# Patient Record
Sex: Female | Born: 1980
Health system: Southern US, Community
[De-identification: ages and names within clinical notes are randomized; demographics above are authoritative.]

## PROBLEM LIST (undated history)

## (undated) ENCOUNTER — Inpatient Hospital Stay (HOSPITAL_COMMUNITY): Payer: Self-pay

## (undated) HISTORY — PX: NO PAST SURGERIES: SHX2092

---

## 2008-05-09 ENCOUNTER — Emergency Department (HOSPITAL_COMMUNITY): Admission: EM | Admit: 2008-05-09 | Discharge: 2008-05-09 | Payer: Self-pay | Admitting: Emergency Medicine

## 2009-08-05 ENCOUNTER — Ambulatory Visit (HOSPITAL_COMMUNITY): Admission: RE | Admit: 2009-08-05 | Discharge: 2009-08-05 | Payer: Self-pay | Admitting: Obstetrics & Gynecology

## 2009-11-26 ENCOUNTER — Ambulatory Visit: Payer: Self-pay | Admitting: Obstetrics & Gynecology

## 2009-12-03 ENCOUNTER — Ambulatory Visit: Payer: Self-pay | Admitting: Obstetrics and Gynecology

## 2009-12-03 ENCOUNTER — Inpatient Hospital Stay (HOSPITAL_COMMUNITY): Admission: AD | Admit: 2009-12-03 | Discharge: 2009-12-07 | Payer: Self-pay | Admitting: Family Medicine

## 2009-12-11 ENCOUNTER — Inpatient Hospital Stay (HOSPITAL_COMMUNITY): Admission: AD | Admit: 2009-12-11 | Discharge: 2009-12-11 | Payer: Self-pay | Admitting: Obstetrics & Gynecology

## 2009-12-11 ENCOUNTER — Ambulatory Visit: Payer: Self-pay | Admitting: Advanced Practice Midwife

## 2009-12-17 ENCOUNTER — Ambulatory Visit: Payer: Self-pay | Admitting: Obstetrics and Gynecology

## 2010-12-26 LAB — CBC
HCT: 38.9 % (ref 36.0–46.0)
MCHC: 33.6 g/dL (ref 30.0–36.0)
MCHC: 33.9 g/dL (ref 30.0–36.0)
MCV: 85.1 fL (ref 78.0–100.0)
Platelets: 177 10*3/uL (ref 150–400)
Platelets: 252 10*3/uL (ref 150–400)
RBC: 3.31 MIL/uL — ABNORMAL LOW (ref 3.87–5.11)
RDW: 14.5 % (ref 11.5–15.5)
WBC: 13.4 10*3/uL — ABNORMAL HIGH (ref 4.0–10.5)

## 2014-05-12 ENCOUNTER — Emergency Department (INDEPENDENT_AMBULATORY_CARE_PROVIDER_SITE_OTHER)
Admission: EM | Admit: 2014-05-12 | Discharge: 2014-05-12 | Disposition: A | Discharge status: Home / Self Care | Payer: Self-pay | Source: Home / Self Care | Attending: Family Medicine | Admitting: Family Medicine

## 2014-05-12 ENCOUNTER — Encounter (HOSPITAL_COMMUNITY): Payer: Self-pay | Admitting: Emergency Medicine

## 2014-05-12 DIAGNOSIS — K2100 Gastro-esophageal reflux disease with esophagitis, without bleeding: Secondary | ICD-10-CM

## 2014-05-12 MED ORDER — OMEPRAZOLE 40 MG PO CPDR: 40.0000 mg | DELAYED_RELEASE_CAPSULE | Freq: Every day | ORAL | Status: DC

## 2014-05-12 NOTE — ED Provider Notes (Signed)
CSN: [id number]     Arrival date & time 05/12/14  1533 History   First MD Initiated Contact with Patient 05/12/14 1624     Chief Complaint  Patient presents with  . Abdominal Pain   (Consider location/radiation/quality/duration/timing/severity/associated sxs/prior Treatment) HPI  Epigastric pain: started today. Burning. Associated w/ nausea. Comes on after meals, especially coffee. Has not taken anything to try to make it better. No change from this morning. Denies fever, diarrhea, CP, SOB, sycnope, palpitations. Some radiation up chest from stomach   History reviewed. No pertinent past medical history. History reviewed. No pertinent past surgical history. No family history on file. History  Substance Use Topics  . Smoking status: Never Smoker   . Smokeless tobacco: Not on file  . Alcohol Use: No   OB History   Grav Para Term Preterm Abortions TAB SAB Ect Mult Living                 Review of Systems Per HPI with all other pertinent systems negative.   Allergies  Review of patient's allergies indicates no known allergies.  Home Medications   Prior to Admission medications   Medication Sig Start Date End Date Taking? Authorizing Provider  omeprazole (PRILOSEC) 40 MG capsule Take 1 capsule (40 mg total) by mouth daily. 05/12/14   Ozella Rocksavid J Elward Nocera, MD   BP 128/87  Pulse 84  Temp(Src) 98.6 F (37 C) (Oral)  Resp 14  SpO2 98% Physical Exam  Constitutional: She is oriented to person, place, and time. She appears well-developed and well-nourished. No distress.  HENT:  Head: Normocephalic and atraumatic.  Eyes: EOM are normal. Pupils are equal, round, and reactive to light.  Neck: Normal range of motion.  Cardiovascular: Normal rate, normal heart sounds and intact distal pulses.   No murmur heard. Pulmonary/Chest: Effort normal and breath sounds normal. No respiratory distress. She has no wheezes. She exhibits no tenderness.  Abdominal: Soft. Bowel sounds are normal. There  is tenderness (minimal epigastric tenderness on palpation. ). There is no rebound and no guarding.  Musculoskeletal: Normal range of motion. She exhibits no edema and no tenderness.  Neurological: She is alert and oriented to person, place, and time. No cranial nerve deficit.  Skin: She is not diaphoretic.  Psychiatric: She has a normal mood and affect. Her behavior is normal. Judgment and thought content normal.    ED Course  Procedures (including critical care time) Labs Review Labs Reviewed - No data to display  Imaging Review No results found.   MDM   1. Reflux esophagitis   No cardiac or pleuritic etiology Start PPI Handouts given Precautions given and all questions answered  Shelly Flattenavid Makylie Rivere, MD Family Medicine 05/12/2014, 4:44 PM      Ozella Rocksavid J Javeion Cannedy, MD 05/12/14 [phone]

## 2014-05-12 NOTE — Discharge Instructions (Signed)
You are suffering from reflux which comes from too much acid in your stomach. This burned your throat.  Please take the prilosec (acid reducing pill) for 14 days. Then as needed Please come back if you get worse.    Opciones de alimentos para pacientes con reflujo gastroesofgico (Food Choices for Gastroesophageal Reflux Disease) Cuando se tiene reflujo gastroesofgico (ERGE), los alimentos que se ingieren y los hbitos de alimentacin son muy importantes. Elegir los alimentos adecuados puede ayudar a Paramedic las molestias ocasionadas por el [place]. QU PAUTAS GENERALES DEBO SEGUIR?  Elija las frutas, los vegetales, los cereales integrales, los productos lcteos, la carne de [place], de pescado y de ave con bajo contenido de grasas.  Limite las grasas, [address] [place], los aderezos para Lennon, la [place], los frutos secos y Programme researcher, broadcasting/film/video.  Lleve un registro de las comidas para identificar los alimentos que ocasionan sntomas.  Evite los alimentos que le ocasionen reflujo. Pueden ser distintos para cada persona.  Haga comidas pequeas con frecuencia en lugar de tres comidas [employer].  Coma lentamente, en un clima distendido.  Limite el consumo de alimentos fritos.  Cocine los alimentos utilizando mtodos que no sean la fritura.  Evite el consumo alcohol.  Evite beber grandes cantidades de lquidos con las comidas.  Evite agacharse o recostarse hasta despus de 2 o 3horas de haber comido. QU ALIMENTOS NO SE RECOMIENDAN? Los siguientes son algunos alimentos y bebidas que pueden empeorar los sntomas: Veterinary surgeon. Jugo de tomate. Salsa de tomate y espagueti. Ajes. Cebolla y [place]. Rbano picante. Frutas Naranjas, pomelos y limn (fruta y [place]). Carnes Carnes de [place], de pescado y de ave con gran contenido de grasas. Esto incluye los perros calientes, las [place], el [place], la salchicha, el salame y el tocino. Lcteos Leche entera y [place]. Merck & Co. Crema. Mantequilla. Helados. Queso crema.  Bebidas Caf y t negro, con o sin cafena Bebidas gaseosas o energizantes. Condimentos Salsa picante. Salsa barbacoa.  Dulces/postres Chocolate y cacao. Rosquillas. Menta y mentol. Grasas y [employer] con alto contenido de grasas, incluidas las papas fritas. Otros Vinagre. Especias picantes, como la [employer], la pimienta blanca, la pimienta roja, la pimienta de cayena, el curry en [place], los clavos de [place], el jengibre y el [place] en polvo. Los artculos mencionados arriba pueden no ser [employer]on de las bebidas y los alimentos que se Theatre stage manager. Comunquese con el nutricionista para recibir ms informacin. Document Released: 06/28/2005 Document Revised: 09/23/2013 [facility] Patient Information 2015 [place], [place]. This information is not intended to replace advice given to you by your health care provider. Make sure you discuss any questions you have with your health care provider.  Esofagitis  (Esophagitis)  La esofagitis es la inflamacin del esfago. Puede haber inflamacin y Engineer, mining. Este problema hacer que tragar sea difcil y doloroso. CAUSAS  La mayora de las causas que producen esofagitis no son graves. Diferentes factores pueden ocasionarla, entre ellos:  Reflujo gastroesofgico. El reflujo gastroesofgico ocurre cuando el cido del estmago pasa al esfago.  Vmitos recurrentes.  Reacciones alrgicas.  Ciertos medicamentos, especialmente aquellos que vienen en pastillas grandes.  La ingestin de productos qumicos nocivos, tales como productos de limpieza del hogar.  Consumo excesivo de alcohol.  Una infeccin del esfago.  Tratamiento de radiacin para Management consultant.  Ciertas enfermedades como la sarcoidosis, enfermedad de Crohn, y la esclerodermia. Estas enfermedades pueden causar esofagitis recurrente. SNTOMAS   Dificultad para tragar.  Dolor al tragar.  Dolor en el pecho.  Dificultad  para respirar.  Nuseas.  Vmitos.  Dolor abdominal. DIAGNSTICO  El mdico le preguntar acerca de sus sntomas y le har un examen fsico. Dependiendo de lo que el mdico encuentre, tambin podr Radio producerindicar ciertas pruebas, por ejemplo:   Radiografa de bario. Le darn para beber una solucin que recubre el esfago, y se tomarn radiografas.  Endoscopa. Se inserta un tubo con luz por el esfago para que el mdico pueda examinar el rea.  Pruebas de alergia. A veces se pueden realizar en las visitas de control. TRATAMIENTO  El tratamiento depender de la causa de la esofagitis. En algunos casos, le recetarn corticoides u otros medicamentos para ayudar a Paramedicaliviar sus sntomas o tratar la causa subyacente del problema. Los medicamentos que le podrn recetar son:   Raynelle DickLidocana viscosa, para Catering managersuavizar el esfago.  Anticidos.  Reductores del cido.  Inhibidores de la bomba de protones.  Antivirales para ciertas infecciones virales del esfago.  Antimicticos para ciertas infecciones fngicas en el esfago.  Antibiticos, dependiendo de la causa de la esofagitis. INSTRUCCIONES PARA EL CUIDADO EN EL HOGAR   Evite las comidas y bebidas que United Stationersempeoran los problemas.  Haga comidas pequeas durante Glass blower/designerel da en lugar de 3 comidas abundantes.  Evite comer durante las 3 horas antes de Conneracostarse.  Si tiene problemas para Theme park managertomar pastillas, use un cortador de pldoras para disminuir el tamao y la probabilidad de que la pldora se [place]queda pegada y lesione el fondo del esfago. Beber agua despus de tomar una pldora tambin ayuda.  Si fuma, abandone el hbito.  Mantenga un peso saludable.  Use ropas sueltas. No use nada apretado alrededor de la cintura que cause presin en el estmago.  Levante la cabecera de la cama 6 a 8 pulgadas (15 a 20 cm) con bloques de madera. Usar almohadas extra no ayuda.  Tome slo medicamentos de venta libre o recetados, segn las indicaciones del mdico. SOLICITE  ATENCIN MDICA DE INMEDIATO SI:   Siente un dolor intenso en el pecho que se irradia hacia el cuello, los brazos o la Boveymandbula.  Se siente transpirado, mareado o sufre un desmayo.  Le falta el aire.  Vomita sangre.  Tiene dificultad o dolor al tragar.  La materia fecal es negra, de aspecto alquitranado.  Tiene fiebre.  Tiene una sensacin de ardor en el pecho ms de 3 veces a la semana durante ms de 2 semanas.  No puede tragar, beber o comer.  Babea porque no puede tragar la saliva. ASEGRESE DE QUE:   Comprende estas instrucciones.  Controlar su enfermedad.  Solicitar ayuda de inmediato si no mejora o si empeora. Document Released: 09/18/2005 Document Revised: 12/11/2011 [facility] Patient Information 2015 [place], [place]. This information is not intended to replace advice given to you by your health care provider. Make sure you discuss any questions you have with your health care provider.

## 2014-05-12 NOTE — ED Notes (Signed)
Epigastric pain, "pressure" in chest, nauseated, sob- onset this am.  Denies cough, denies cold symptoms, denies runny nose.

## 2014-10-24 ENCOUNTER — Emergency Department (HOSPITAL_COMMUNITY): Payer: Self-pay

## 2014-10-24 ENCOUNTER — Emergency Department (HOSPITAL_COMMUNITY)
Admission: EM | Admit: 2014-10-24 | Discharge: 2014-10-24 | Disposition: A | Discharge status: Home / Self Care | Payer: Self-pay | Attending: Emergency Medicine | Admitting: Emergency Medicine

## 2014-10-24 ENCOUNTER — Encounter (HOSPITAL_COMMUNITY): Payer: Self-pay | Admitting: *Deleted

## 2014-10-24 DIAGNOSIS — Z3202 Encounter for pregnancy test, result negative: Secondary | ICD-10-CM | POA: Insufficient documentation

## 2014-10-24 DIAGNOSIS — Z79899 Other long term (current) drug therapy: Secondary | ICD-10-CM | POA: Insufficient documentation

## 2014-10-24 DIAGNOSIS — R102 Pelvic and perineal pain: Secondary | ICD-10-CM | POA: Insufficient documentation

## 2014-10-24 LAB — COMPREHENSIVE METABOLIC PANEL
ALBUMIN: 4.8 g/dL (ref 3.5–5.2)
ALK PHOS: 51 U/L (ref 39–117)
ALT: 23 U/L (ref 0–35)
ANION GAP: 11 (ref 5–15)
AST: 28 U/L (ref 0–37)
BUN: 8 mg/dL (ref 6–23)
CALCIUM: 9.6 mg/dL (ref 8.4–10.5)
CO2: 25 mmol/L (ref 19–32)
CREATININE: 0.63 mg/dL (ref 0.50–1.10)
Chloride: 100 mmol/L (ref 96–112)
GFR calc Af Amer: >90 mL/min (ref >90)
Glucose, Bld: 94 mg/dL (ref 70–99)
POTASSIUM: 3.6 mmol/L (ref 3.5–5.1)
SODIUM: 136 mmol/L (ref 135–145)
Total Bilirubin: 1.4 mg/dL — ABNORMAL HIGH (ref 0.3–1.2)
Total Protein: 8.1 g/dL (ref 6.0–8.3)

## 2014-10-24 LAB — CBC WITH DIFFERENTIAL/PLATELET
BASOS PCT: 1 % (ref 0–1)
Basophils Absolute: 0.1 10*3/uL (ref 0.0–0.1)
Eosinophils Absolute: 1 10*3/uL — ABNORMAL HIGH (ref 0.0–0.7)
Eosinophils Relative: 12 % — ABNORMAL HIGH (ref 0–5)
HEMATOCRIT: 42.3 % (ref 36.0–46.0)
Hemoglobin: 14.2 g/dL (ref 12.0–15.0)
LYMPHS ABS: 3 10*3/uL (ref 0.7–4.0)
Lymphocytes Relative: 34 % (ref 12–46)
MCH: 27.8 pg (ref 26.0–34.0)
MCHC: 33.6 g/dL (ref 30.0–36.0)
MCV: 82.9 fL (ref 78.0–100.0)
Monocytes Absolute: 0.6 10*3/uL (ref 0.1–1.0)
Monocytes Relative: 7 % (ref 3–12)
NEUTROS ABS: 4.1 10*3/uL (ref 1.7–7.7)
Neutrophils Relative %: 46 % (ref 43–77)
PLATELETS: 318 10*3/uL (ref 150–400)
RBC: 5.1 MIL/uL (ref 3.87–5.11)
RDW: 13 % (ref 11.5–15.5)
WBC: 8.9 10*3/uL (ref 4.0–10.5)

## 2014-10-24 LAB — URINALYSIS, ROUTINE W REFLEX MICROSCOPIC
Bilirubin Urine: NEGATIVE
GLUCOSE, UA: NEGATIVE mg/dL
HGB URINE DIPSTICK: NEGATIVE
KETONES UR: NEGATIVE mg/dL
Leukocytes, UA: NEGATIVE
Nitrite: NEGATIVE
Protein, ur: NEGATIVE mg/dL
SPECIFIC GRAVITY, URINE: 1.016 (ref 1.005–1.030)
Urobilinogen, UA: 0.2 mg/dL (ref 0.0–1.0)
pH: 7 (ref 5.0–8.0)

## 2014-10-24 LAB — POC URINE PREG, ED: Preg Test, Ur: NEGATIVE

## 2014-10-24 LAB — WET PREP, GENITAL
TRICH WET PREP: NONE SEEN
Yeast Wet Prep HPF POC: NONE SEEN

## 2014-10-24 IMAGING — US US TRANSVAGINAL NON-OB
1 series · 13 of 25 positions shown · non-contrast
Comparison: None

CLINICAL DATA: Pelvic pain, duration 2 weeks



[Series 1: us transvaginal non-ob · 0.21mm/px · 67 acquisitions, 13 frames shown]
[im 1/67]
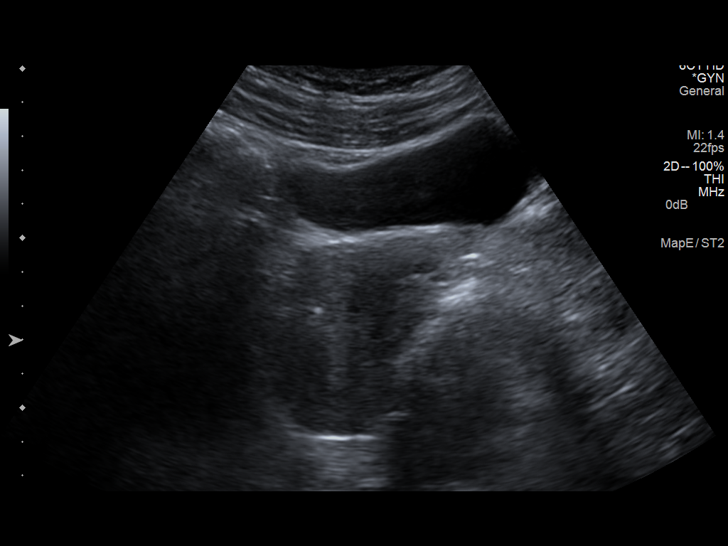
[im 6/67]
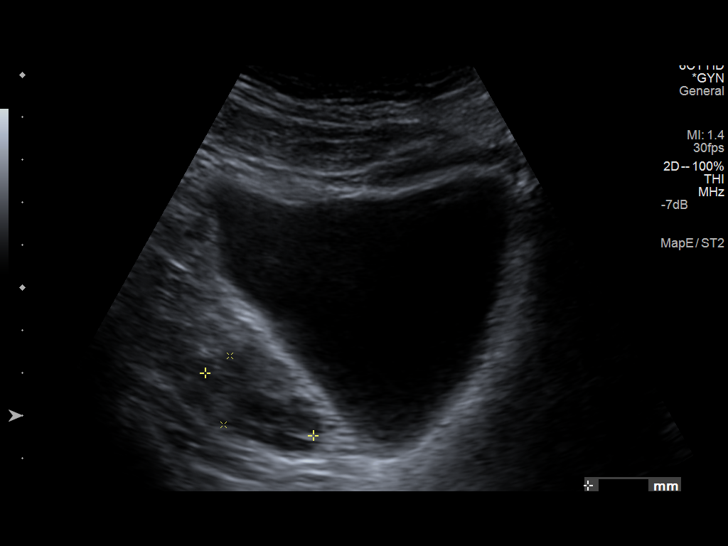
[im 12/67]
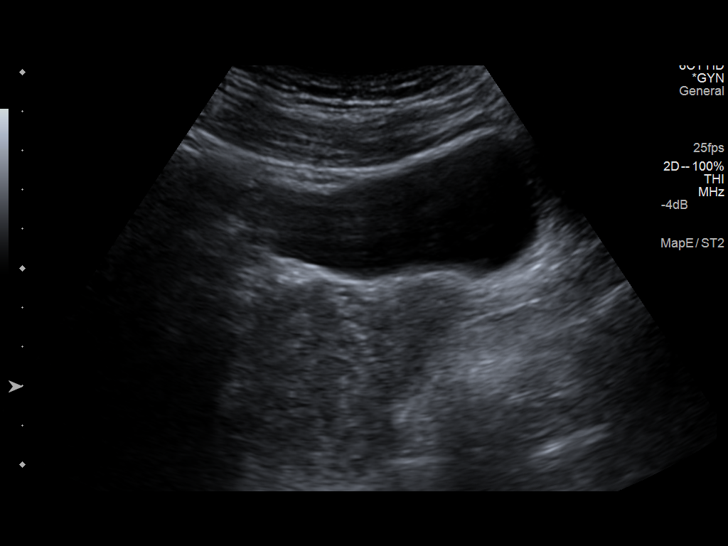
[im 17/67]
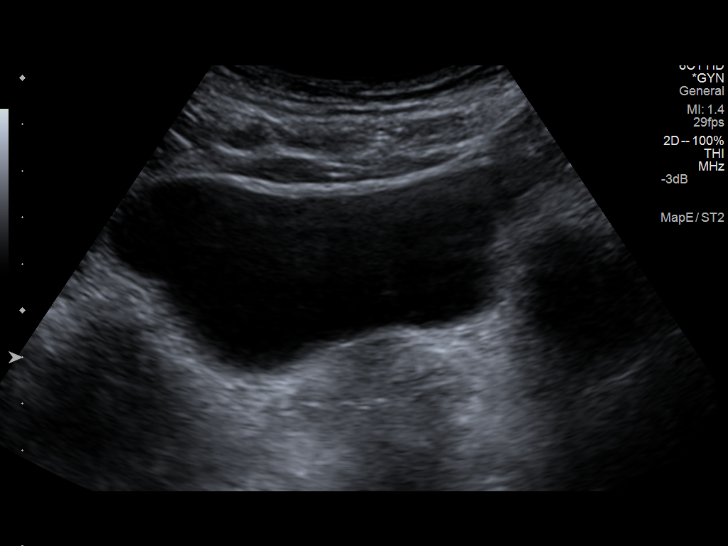
[im 23/67]
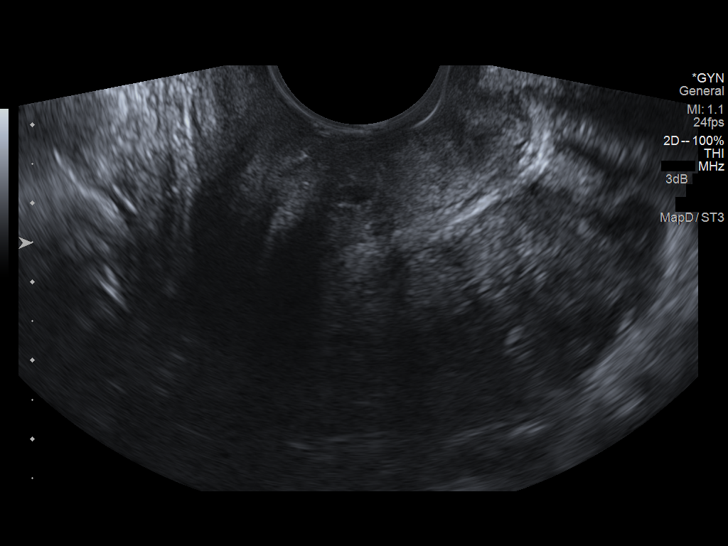
[im 28/67]
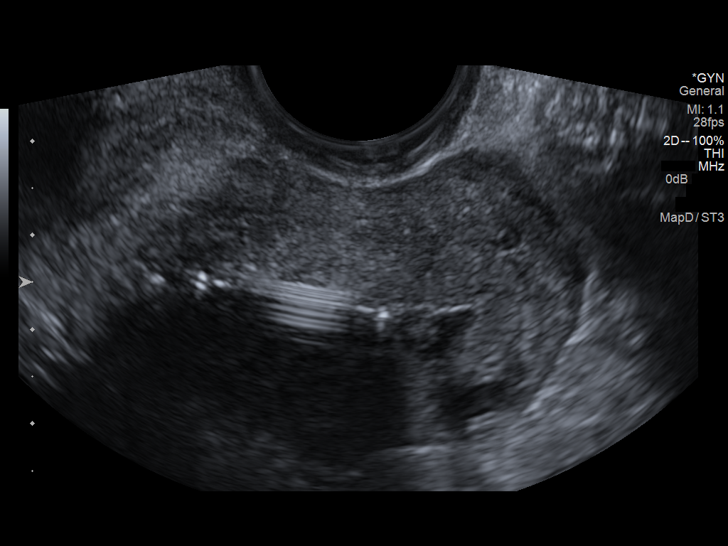
[im 34/67]
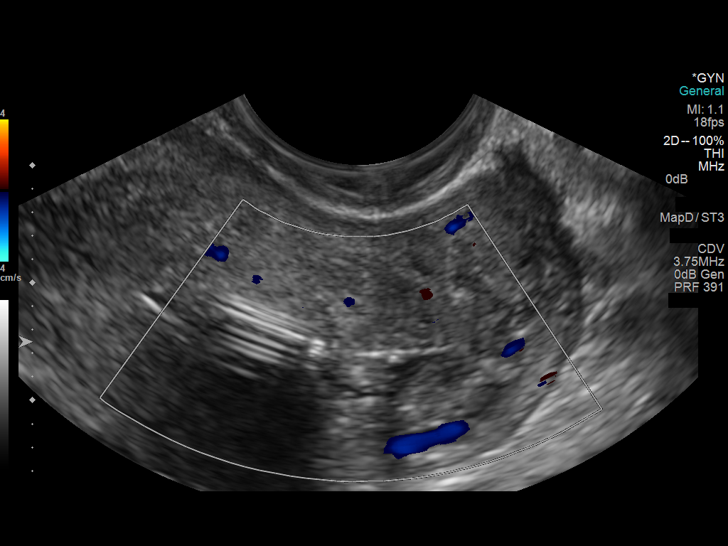
[im 39/67]
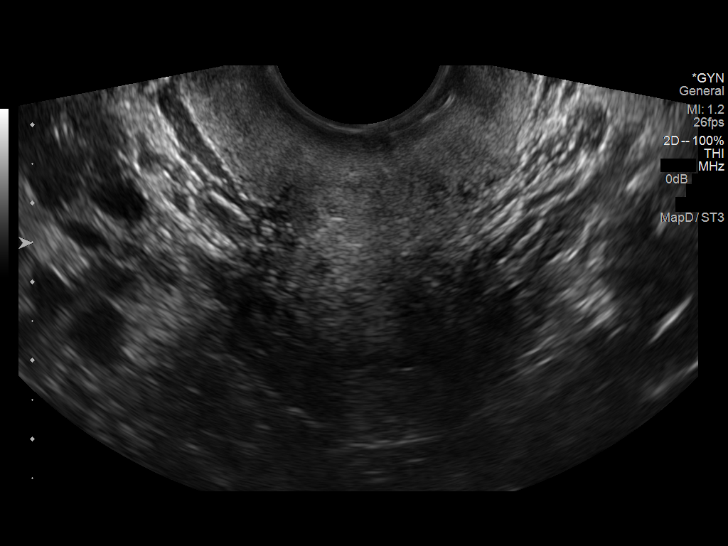
[im 45/67]
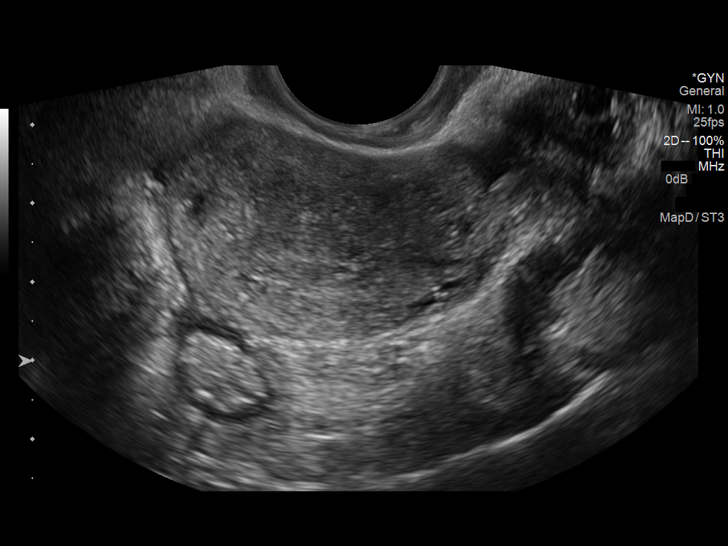
[im 50/67]
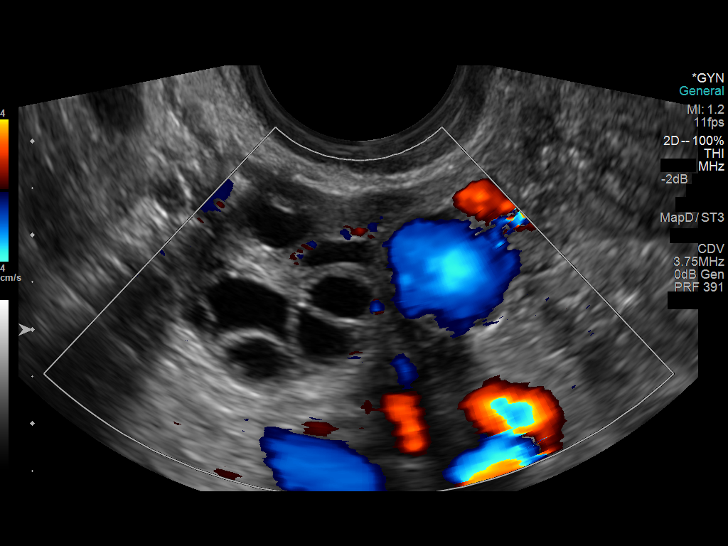
[im 56/67]
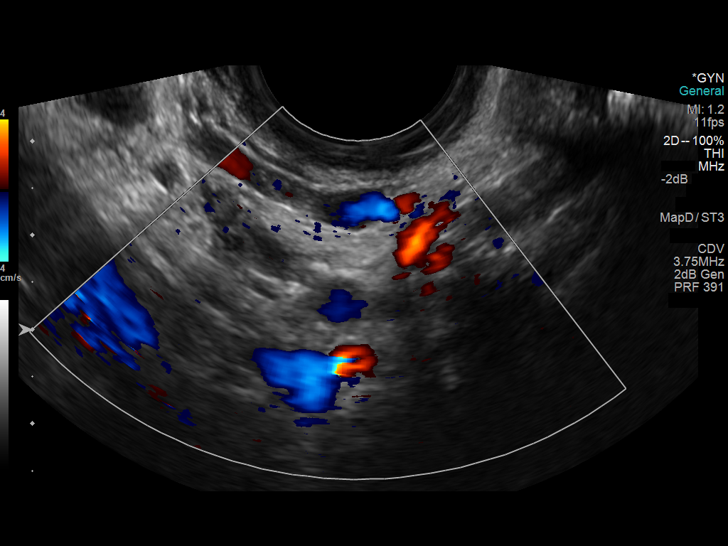
[im 61/67]
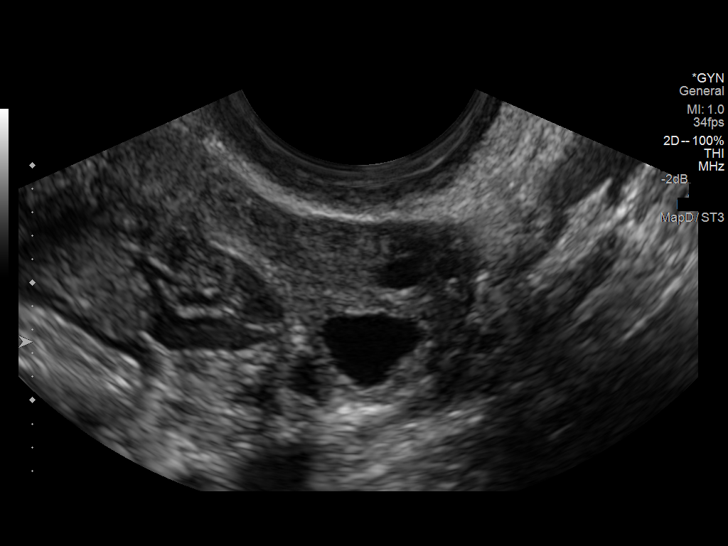
[im 67/67]
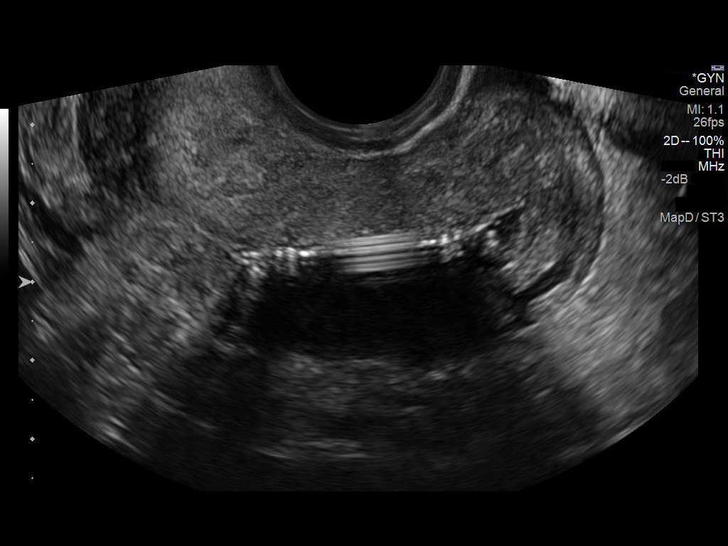

[13 of 25 positions shown; findings below may reference images not displayed]

FINDINGS: Uterus

Measurements: 8.0 by 3.7 by 5.3 cm. No fibroids or other mass
visualized. Retroverted position.

Endometrium

Thickness: 2 mm. IUD in place. Small amount of fluid in the
endometrial cavity. Indistinct echogenicity along the fundal margin
of the IUD, about 4 mm in diameter, potentially from blood products
or debris, without internal flow signal observed.

Right ovary

Measurements: 2.9 by 2.6 by 2.5 cm. Normal appearance/no adnexal
mass.

Left ovary

Measurements: 3.5 by 2.1 by 1.7 cm. Normal appearance/no adnexal
mass.

Other findings

No free fluid.  Mild prominence of parametrial vasculature.
IMPRESSION: 1. IUD in place. There is a small amount of surrounding fluid in the
endometrial cavity along with a 4 mm in diameter focus of echogenic
material along the fundal side of the IUD. This could represent
blood products. If the patient is having clinical symptoms of
endometritis then removal of the IUD is likely warranted.
2. Prominence of parametrial vasculature. The this is typically
incidental but can also be encountered in pelvic congestion.

## 2014-10-24 MED ORDER — IBUPROFEN 800 MG PO TABS: 800.0000 mg | ORAL_TABLET | Freq: Three times a day (TID) | ORAL | Status: DC

## 2014-10-24 NOTE — ED Notes (Addendum)
Pt reports llq pain for 2 weeks, worse yesterday and got worse. Denies issues using the restroom or vaginal complaints. Pt repors worse pain when laying on left side,

## 2014-10-24 NOTE — ED Provider Notes (Signed)
CSN: [id number]     Arrival date & time 10/24/14  1316 History   First MD Initiated Contact with Patient 10/24/14 1328     Chief Complaint  Patient presents with  . Abdominal Pain     (Consider location/radiation/quality/duration/timing/severity/associated sxs/prior Treatment) HPI The patient has had left lower quadrant pain that has been present for 2 weeks duration. It got significantly worse last night. The patient reports she couldn't sleep last night due to pain. It has a burning quality to it. The patient has had some nausea associated but no vomiting, diarrhea, pain or burning with urination. The patient reports she has a Mirena IUD and thus does not have regular periods. No abnormal vaginal discharge. She denies ever having had similar pain. History reviewed. No pertinent past medical history. History reviewed. No pertinent past surgical history. History reviewed. No pertinent family history. History  Substance Use Topics  . Smoking status: Never Smoker   . Smokeless tobacco: Not on file  . Alcohol Use: No   OB History    No data available     Review of Systems  10 Systems reviewed and are negative for acute change except as noted in the HPI.   Allergies  Review of patient's allergies indicates no known allergies.  Home Medications   Prior to Admission medications   Medication Sig Start Date End Date Taking? Authorizing Provider  ibuprofen (ADVIL,MOTRIN) 800 MG tablet Take 1 tablet (800 mg total) by mouth 3 (three) times daily. 10/24/14   Arby BarretteMarcy Izel Eisenhardt, MD  omeprazole (PRILOSEC) 40 MG capsule Take 1 capsule (40 mg total) by mouth daily. 05/12/14   Ozella Rocksavid J Merrell, MD   BP 116/69 mmHg  Pulse 100  Temp(Src) 97.9 F (36.6 C) (Oral)  Resp 18  SpO2 99% Physical Exam  Constitutional: She is oriented to person, place, and time. She appears well-developed and well-nourished.  HENT:  Head: Normocephalic and atraumatic.  Eyes: EOM are normal. Pupils are equal, round,  and reactive to light.  Neck: Neck supple.  Cardiovascular: Normal rate, regular rhythm, normal heart sounds and intact distal pulses.   Pulmonary/Chest: Effort normal and breath sounds normal.  Abdominal: Soft. Bowel sounds are normal. She exhibits no distension. There is tenderness (Patient has mild tenderness left lower quadrant. There is no guarding or mass present.).  Genitourinary: Vagina normal. No vaginal discharge found.  Speculum examination vaginal vault is normal in appearance. Cervix has no drainage or discharge. IUD string is visible. Bimanual examination has no cervical motion tenderness. The uterus is nontender to palpation. Pain localizes in the left adnexa. No appreciable adnexal mass or fullness.  Musculoskeletal: Normal range of motion. She exhibits no edema.  Neurological: She is alert and oriented to person, place, and time. She has normal strength. Coordination normal. GCS eye subscore is 4. GCS verbal subscore is 5. GCS motor subscore is 6.  Skin: Skin is warm, dry and intact.  Psychiatric: She has a normal mood and affect.    ED Course  Procedures (including critical care time) Labs Review Labs Reviewed  WET PREP, GENITAL - Abnormal; Notable for the following:    Clue Cells Wet Prep HPF POC FEW (*)    WBC, Wet Prep HPF POC FEW (*)    All other components within normal limits  COMPREHENSIVE METABOLIC PANEL - Abnormal; Notable for the following:    Total Bilirubin 1.4 (*)    All other components within normal limits  CBC WITH DIFFERENTIAL/PLATELET - Abnormal; Notable for the following:  Eosinophils Relative 12 (*)    Eosinophils Absolute 1.0 (*)    All other components within normal limits  URINALYSIS, ROUTINE W REFLEX MICROSCOPIC  POC URINE PREG, ED  GC/CHLAMYDIA PROBE AMP ([place])    Imaging Review US Transvaginal Non-ob  10/24/2014   CLINICAL DATA:  Pelvic pain, duration 2 weeks  EXAM: TRANSABDOMINAL AND TRANSVAGINAL ULTRASOUND OF PELVIS  TECHNIQUE:  Both transabdominal and transvaginal ultrasound examinations of the pelvis were performed. Transabdominal technique was performed for global imaging of the pelvis including uterus, ovaries, adnexal regions, and pelvic cul-de-sac. It was necessary to proceed with endovaginal exam following the transabdominal exam to visualize the endometrium.  COMPARISON:  None  FINDINGS: Uterus  Measurements: 8.0 by 3.7 by 5.3 cm. No fibroids or other mass visualized. Retroverted position.  Endometrium  Thickness: 2 mm. IUD in place. Small amount of fluid in the endometrial cavity. Indistinct echogenicity along the fundal margin of the IUD, about 4 mm in diameter, potentially from blood products or debris, without internal flow signal observed.  Right ovary  Measurements: 2.9 by 2.6 by 2.5 cm. Normal appearance/no adnexal mass.  Left ovary  Measurements: 3.5 by 2.1 by 1.7 cm. Normal appearance/no adnexal mass.  Other findings  No free fluid.  Mild prominence of parametrial vasculature.  IMPRESSION: 1. IUD in place. There is a small amount of surrounding fluid in the endometrial cavity along with a 4 mm in diameter focus of echogenic material along the fundal side of the IUD. This could represent blood products. If the patient is having clinical symptoms of endometritis then removal of the IUD is likely warranted. 2. Prominence of parametrial vasculature. The this is typically incidental but can also be encountered in pelvic congestion.   Electronically Signed   By: Herbie Baltimore M.D.   On: 10/24/2014 15:45   US Pelvis Complete  10/24/2014   CLINICAL DATA:  Pelvic pain, duration 2 weeks  EXAM: TRANSABDOMINAL AND TRANSVAGINAL ULTRASOUND OF PELVIS  TECHNIQUE: Both transabdominal and transvaginal ultrasound examinations of the pelvis were performed. Transabdominal technique was performed for global imaging of the pelvis including uterus, ovaries, adnexal regions, and pelvic cul-de-sac. It was necessary to proceed with endovaginal  exam following the transabdominal exam to visualize the endometrium.  COMPARISON:  None  FINDINGS: Uterus  Measurements: 8.0 by 3.7 by 5.3 cm. No fibroids or other mass visualized. Retroverted position.  Endometrium  Thickness: 2 mm. IUD in place. Small amount of fluid in the endometrial cavity. Indistinct echogenicity along the fundal margin of the IUD, about 4 mm in diameter, potentially from blood products or debris, without internal flow signal observed.  Right ovary  Measurements: 2.9 by 2.6 by 2.5 cm. Normal appearance/no adnexal mass.  Left ovary  Measurements: 3.5 by 2.1 by 1.7 cm. Normal appearance/no adnexal mass.  Other findings  No free fluid.  Mild prominence of parametrial vasculature.  IMPRESSION: 1. IUD in place. There is a small amount of surrounding fluid in the endometrial cavity along with a 4 mm in diameter focus of echogenic material along the fundal side of the IUD. This could represent blood products. If the patient is having clinical symptoms of endometritis then removal of the IUD is likely warranted. 2. Prominence of parametrial vasculature. The this is typically incidental but can also be encountered in pelvic congestion.   Electronically Signed   By: Herbie Baltimore M.D.   On: 10/24/2014 15:45     EKG Interpretation None      MDM  Final diagnoses:  Pelvic pain in female   Pregnancy is negative and urinalysis is negative. The patient's abdominal pain localizes on examination to the left adnexa. Ultrasound suggests a small amount of fluid adjacent to the Mirena IUD and the possibility of endometritis. Based on patient's examination I doubt this diagnosis. With the bimanual examination I was able to palpate the uterus and perform cervix motion without eliciting any significant tenderness. Localizing over the adnexa and away from the uterus re-create of the area of pain. The patient is well in appearance and has a nonsurgical abdominal examination. There was absolutely no  discharge coming from the cervical os and no friability. At this point I believe the patient needs a GYN exam this week to determine if the Mirena should be removed. I did not feel that it needed to be removed at this time based on my findings. The patient will be counseled on the needed follow-up.   Arby Barrette, MD 10/24/14 [phone]

## 2014-10-24 NOTE — Discharge Instructions (Signed)
Dolor pélvico  °(Pelvic Pain) ° Las causa del dolor pélvico en la mujer pueden ser muchas y pueden tener su origen en diferentes lugares. El dolor pélvico es el que aparece en la mitad inferior del abdomen y entre las caderas. Puede aparecer durante en un período corto de tiempo (agudo)o puede ser recurrente (crónico). Esta afección puede estar relacionada con trastornos que afectan a los órganos reproductivos femeninos (ginecológica), pero también puede deberse a problemas en la vejiga, cálculos renales, complicaciones intestinales, o problemas musculares o esqueléticos. Es importante solicitar ayuda de inmediato, sobre todo si ha sido intenso, agudo, o ha aparecido de manera súbita como un dolor inusual. También es importante obtener ayuda de inmediato, ya que algunos tipos de dolor pélvico puede poner en peligro la vida.  °CAUSAS  °A continuación veremos algunas de las causas del dolor pélvico. Las causas pueden clasificarse de diferentes modos.  °· Ginecológica. °¨ Enfermedad inflamatoria pélvica. °¨ Infecciones de transmisión sexual. °¨ Quiste de ovario o torsión de un ligamento ovárico ( torsión ovárica). °¨ La membrana que recubre internamente al útero desarrollándose fuera del útero (endometriosis). °¨ Fibromas, quistes o tumores. °¨ Ovulación. °· Embarazo. °¨ Embarazo fuera del útero (embarazo ectópico). °¨ Aborto espontáneo. °¨ Trabajo de parto. °¨ Desprendimiento de la placenta o ruptura del útero. °· Infecciones. °¨ Infección uterina (endometritis). °¨ Infección de la vejiga. °¨ Diverticulitis. °¨ Aborto relacionado con una infección uterina (aborto séptico). °· Vejiga. °¨ Inflamación de la vejiga (cistitis). °¨ Cálculos renales. °· Gastrointenstinal. °¨ Estreñimiento. °¨ Diverticulitis. °· Neurológico. °¨ Traumatismos. °¨ Sentir dolor pélvico debido a causas mentales o emocionales (psicosomático). °· Tumores en el intestino o en la pelvis. °EVALUACIÓN  °El médico hará una historia clínica detallada  según sus síntomas. Incluirá los cambios recientes en su salud, una cuidadosa historia ginecológica de sus periodos (menstruaciones) y una historia de su actividad sexual. Los antecedentes familiares y la historia clínica también son importantes. Su médico podrá indicar un examen pélvico. El examen pélvico ayudará a identificar la ubicación y la gravedad del dolor. También ayudará a evaluar los órganos que pueden estar involucrados. . Para identificar la causa del dolor pélvico y tratarlo adecuadamente, el médico puede indicar estudios. Estas pruebas pueden ser:  °· Test de embarazo. °· Ecografía pélvica. °· Radiografía del abdomen. °· Un análisis de orina o la evaluación de la secreción vaginal. °· Análisis de sangre. °INSTRUCCIONES PARA EL CUIDADO EN EL HOGAR  °· Solo tome medicamentos de venta libre o recetados para el dolor, malestar o fiebre, según las indicaciones del médico.   °· Haga reposo según las indicaciones del médico.   °· Consuma una dieta balanceada.   °· Beba gran cantidad de líquido para mantener la orina de tono claro o amarillo pálido.   °· Evite las relaciones sexuales, si le producen dolor.   °· Aplique compresas calientes o frías en la zona baja del abdomen según cual le calme el dolor.   °· Evite las situaciones estresantes.   °· Lleve un registro del dolor pélvico. Anote cuándo comenzó, dónde se localiza el dolor y si hay cosas que parecen estar asociadas con el dolor, como algún alimento o su ciclo menstrual. °· Concurra a las visitas de control con el médico, según las indicaciones.   °SOLICITE ATENCIÓN MÉDICA SI:  °· Los medicamentos no le calman el dolor. °· Tiene flujo vaginal anormal. °SOLICITE ATENCIÓN MÉDICA DE INMEDIATO SI:  °· Tiene un sangrado abundante por la vagina.   °· El dolor pélvico aumenta.   °· Se siente mareada o sufre un desmayo.   °·   Siente escalofros.   Siente dolor intenso al Geographical information systems officer u observa sangre en la orina.   Tiene diarrea o vmitos que no puede  controlar.   Tiene fiebre o sntomas que persisten durante ms de [address]  [address].  Tiene fiebre y los sntomas [address]   Ha sido abusada fsica o sexualmente.  ASEGRESE DE QUE:   Comprende estas instrucciones.  Controlar su enfermedad.  Solicitar ayuda de inmediato si no mejora o si empeora. Document Released: 12/15/2008 Document Revised: 02/02/2014 [facility] Patient Information 2015 [place], [place]. This information is not intended to replace advice given to you by your health care provider. Make sure you discuss any questions you have with your health care provider.  Emergency Department Resource Guide 1) Find a Doctor and Pay Out of Pocket Although you won't have to find out who is covered by your insurance plan, it is a good idea to ask around and get recommendations. You will then need to call the office and see if the doctor you have chosen will accept you as a new patient and what types of options they offer for patients who are self-pay. Some doctors offer discounts or will set up payment plans for their patients who do not have insurance, but you will need to ask so you aren't surprised when you get to your appointment.  2) Contact Your Local Health Department Not all health departments have doctors that can see patients for sick visits, but many do, so it is worth a call to see if yours does. If you don't know where your local health department is, you can check in your phone book. The CDC also has a tool to help you locate your state's health department, and many state websites also have listings of all of their local health departments.  3) Find a Walk-in Clinic If your illness is not likely to be very severe or complicated, you may want to try a walk in clinic. These are popping up all over the country in pharmacies, drugstores, and shopping centers. They're usually staffed by nurse practitioners or physician assistants that have been trained to treat common illnesses and  complaints. They're usually fairly quick and inexpensive. However, if you have serious medical issues or chronic medical problems, these are probably not your best option.  No Primary Care Doctor: - Call Health Connect at  [phone] - they can help you locate a primary care doctor that  accepts your insurance, provides certain services, etc. - Physician Referral Service- [phone]  Chronic Pain Problems: Organization         Address  Phone   Notes  [facility]  [phone] Patients need to be referred by their primary care doctor.   Medication Assistance: Organization         Address  Phone   Notes  [facility] [address] [place] [place], [place] [address] [phone] --Must be a resident of [facility] -- Must have NO insurance coverage whatsoever (no Medicaid/ Medicare, etc.) -- The pt. MUST have a primary care doctor that directs their care regularly and follows them in the community   [facility]ist  [phone]   [employer]  [phone]    Agencies that provide inexpensive medical care: Organization         Address  Phone   Notes  [facility]  [phone]   [facility]    [phone]   [facility] [address] [place], [place] [address] [phone]   [facility] [address] [place], [place] [phone]   [facility]anned Parenthood    [phone]   [facility]    [phone]   [facility]  [address], [place] Phone:  [phone], Fax:  [phone] Hours of Operation:  9 am - 6 pm, M-F.  Also accepts Medicaid/Medicare and self-pay.  [facility]  [address], [place] Phone: [phone], Fax: [phone]. Hours of Operation:  8:30 am - 5:30 pm, M-F.  Also accepts Medicaid and self-pay.  [facility] [place] [address] [place] Phone: [phone]   [facility] [address] Natasha Bence [place], [place] [phone], Ext. [phone] Mondays & Thursdays: 7-9 AM.  First 15 patients are seen on a first come, first serve basis.    Medicaid-accepting [facility] Providers:  Organization         Address  Phone   Notes  [facility] [address], [place] [phone] Also accepts self-pay patients.  [facility] [address] Laurell Josephs [place], [place]  [phone]   [facility] [address], [place] [phone]   [facility] [address] [place] [phone]   Renaye Rakers [address], [place]   [phone] Only accepts [place] Access [place] patients after they have their name applied to their card.   Self-Pay (no insurance) in [facility]:  Organization         Address  Phone   Notes  Sickle Cell Patients, [facility] [address] [place], [place] [phone]   [facility] [address] [place], [place] [phone]   [facility] [place]  [address], [place] [phone]   [facility]/Dr. Osei-Bonsu  [address], [place] or [address], [place] [phone] Phone number for both [place] and [place] locations is the same.  [facility] [address], [place] [phone]   [facility] [address], [place] or [address] [phone]   [facility] [address] [place] [phone], phone; [phone], fax Sees patients 1st and 3rd Saturday of every month.  Must not qualify for public or private insurance (i.e. Medicaid, Medicare, [place] Health Choice, Veterans' Benefits)  Household income should be no more than 200% of the poverty level  The clinic cannot treat you if you are pregnant or think you are pregnant  Sexually transmitted diseases are not treated at the clinic.    Dental Care: Organization         Address  Phone  Notes  [facility] Department of [facility] [address] [place], [place] [phone] Accepts children up to age 61 who are enrolled in [place] or [place] Health Choice; pregnant women with a Medicaid card; and children who have applied for Medicaid or [place] Health Choice, but were declined, whose parents can pay a reduced fee at time of service.  [facility] Department of [facility]  [address], [place] [phone] Accepts children up to age 58 who are enrolled in  Medicaid or [place] Health Choice; pregnant women with a Medicaid card; and children who have applied for Medicaid or [place] Health Choice, but were declined, whose parents can pay a reduced fee at time of service.  [facility]  [address], [place] [phone] Patients are seen by appointment only. Walk-ins are not accepted. [facility] will see patients 32 years of age and older. Monday - Tuesday (8am-5pm) Most Wednesdays (8:30-5pm) $30 per visit, cash only  [facility]  [address], [facility] [phone] Patients are seen by appointment only. Walk-ins are not accepted. [facility] will see patients 218 years of age and older. One Wednesday Evening (Monthly: Volunteer Based).  $30 per visit, cash only  [facility]  [phone] for adults; Children under age 514, call Graduate Pediatric Dentistry at [phone]. Children aged 474-14, please call [phone]) [phone] to request a pediatric application.  Dental services are provided in all areas of dental care including fillings, crowns and bridges, complete and partial dentures, implants, gum treatment, root canals, and extractions. Preventive care is  also provided. Treatment is provided to both adults and children. Patients are selected via a lottery and there is often a waiting list.   [facility] [address], [place]  [phone] [email or web address]   [facility] [address], [place], [place] [phone], Ext. [phone] Second and Fourth Thursday of each month, opens at 6:30 AM; Clinic ends at 9 AM.  Patients are seen on a first-come first-served basis, and a limited number are seen during each clinic.   [facility]  [address], [place], [place] [phone]) [phone]   Eligibility Requirements You must have lived in [place], [place], or [place] counties for at least the last three months.   You cannot be eligible for state or federal sponsored [place] Cityhealthcare insurance, including CIGNAVeterans Administration, [place]icaid, or [employer]icare.   You generally cannot be eligible for healthcare insurance through your employer.    How to apply: Eligibility screenings are held every Tuesday and Wednesday afternoon from 1:00 pm until 4:00 pm. You do not need an appointment for the interview!  [facility] [address], [place], [place] [phone]   [facility]  [phone]   [facility] [place] [facility]  [phone]   [facility]  [phone]    Behavioral Health Resources in the Community: Intensive Outpatient Programs Organization         Address  Phone  Notes  [facility] [address], [place], [place] [phone]   [facility] [address], [place], [place] [phone]   [facility] [address], [place], [place]  [phone]   [facility] [address],  [place], [place] [phone] or [phone]   Substance Abuse Resources Organization         Address  Phone  Notes  [facility]  [phone]   [facility]  [phone]   [facility]  [phone]   [facility]  [phone]   Residential & Outpatient Substance Abuse Program  [phone]   Psychological Services Organization         Address  Phone  Notes  [facility]  [phone]- [phone]   [facility]  [phone]- [phone]   [facility] [address], [place] [phone] or  [phone]    Mobile Crisis Teams Organization         Address  Phone  Notes  [facility], Mobile Crisis Care Unit  [phone]   [facility]  [address] [place], [place] [phone]   [facility] [address] [place] [phone]    Self-Help/Support Groups Organization         Address  Phone             Notes  [facility] [place] - variety of support groups  [phone]- [phone] Call for more information  Narcotics Anonymous (NA), [facility] [address], [employer] [place]  2 meetings at this location   Statistician         Address  Phone  Notes  [facility] [address],    [place]  [phone]   [facility]  [address] [place] [address], [place], [place] [phone]   [facility] [address] [place], [place] [phone] Admissions: 8am-3pm M-F  [facility] [address]    [place], [place] [phone]   [facility] [address] [place], [place], [place] [phone]   [facility] [address]  [place], [place] [phone]   [facility] - Intensive Outpatient [address] Laurell Josephs [address], [place], [place] [phone]   [facility] [address] [place]  [place], [place] [phone] or [phone]   [facility] ([facility]) [address], [place], [place] [phone] Accepts Medicaid  [facility] [address],  [place] [phone]  Substance Abuse/Addiction Treatment   [facility] Organization         Address  Phone  Notes  [facility]  [phone]   Angie Fava, PhD [address] Ervin Knack [place], [place]   [phone] or [phone]   [facility]   [address] [place], [place] [phone]   [facility] [address] [place], [place] [phone] Insurance/Medicaid/sponsorship through [facility] and Families [address]                                    [place], [place] [phone] Therapy/tele-psych/case  [facility] [address], [place] [phone]    Dr. Lolly Mustache  [phone]   [facility] [place]  [employer] [facility] Dept. 1) [address] [place] 2) [address] [place] 3)  [address], [place] [phone]  [phone]   [facility] Child Abuse Hotline [phone] or [phone] (After Hours)

## 2014-10-24 NOTE — ED Notes (Signed)
Pt reports LLQ pain x 2 week, having nausea but denies vomiting, diarrhea or urinary symptoms.

## 2014-10-26 LAB — GC/CHLAMYDIA PROBE AMP (~~LOC~~) NOT AT ARMC
Chlamydia: NEGATIVE
Neisseria Gonorrhea: NEGATIVE

## 2015-07-19 LAB — OB RESULTS CONSOLE RUBELLA ANTIBODY, IGM: RUBELLA: IMMUNE

## 2015-07-19 LAB — OB RESULTS CONSOLE ABO/RH: RH Type: POSITIVE

## 2015-07-19 LAB — OB RESULTS CONSOLE ANTIBODY SCREEN: Antibody Screen: NEGATIVE

## 2015-07-19 LAB — OB RESULTS CONSOLE HEPATITIS B SURFACE ANTIGEN: Hepatitis B Surface Ag: NEGATIVE

## 2015-07-19 LAB — OB RESULTS CONSOLE RPR: RPR: NONREACTIVE

## 2015-07-19 LAB — OB RESULTS CONSOLE HIV ANTIBODY (ROUTINE TESTING): HIV: NONREACTIVE

## 2015-10-03 NOTE — L&D Delivery Note (Signed)
Delivery Note At 1:34 PM a viable female was delivered via Vaginal, Spontaneous Delivery (Presentation: Left Occiput Anterior).  APGAR: 8, 9; weight  .   Placenta status: intact.  Cord: 3 vessel  with the following complications: n/a  Cord pH: n/a  Anesthesia: Epidural  Episiotomy: None Lacerations: 1st degree Right labial and 1st degree perineal Suture Repair: 3.0 vicryl Est. Blood Loss (mL):  100  Mom to postpartum.  Baby to Couplet care / Skin to Skin.  Brenda Dudley, Brenda Dudley 12/26/2015, 1:54 PM

## 2015-10-15 ENCOUNTER — Other Ambulatory Visit: Payer: Self-pay | Admitting: Obstetrics and Gynecology

## 2015-10-15 ENCOUNTER — Other Ambulatory Visit (HOSPITAL_COMMUNITY): Payer: Self-pay | Admitting: Obstetrics and Gynecology

## 2015-10-15 DIAGNOSIS — Z3689 Encounter for other specified antenatal screening: Secondary | ICD-10-CM

## 2015-11-22 ENCOUNTER — Encounter (HOSPITAL_COMMUNITY): Payer: Self-pay

## 2015-11-22 ENCOUNTER — Ambulatory Visit (HOSPITAL_COMMUNITY)
Admission: RE | Admit: 2015-11-22 | Discharge: 2015-11-22 | Disposition: A | Discharge status: Home / Self Care | Payer: Self-pay | Source: Ambulatory Visit | Attending: Obstetrics and Gynecology | Admitting: Obstetrics and Gynecology

## 2015-11-22 DIAGNOSIS — Z36 Encounter for antenatal screening of mother: Secondary | ICD-10-CM | POA: Insufficient documentation

## 2015-11-22 DIAGNOSIS — Z3A34 34 weeks gestation of pregnancy: Secondary | ICD-10-CM | POA: Insufficient documentation

## 2015-11-22 DIAGNOSIS — Z3689 Encounter for other specified antenatal screening: Secondary | ICD-10-CM

## 2015-11-24 ENCOUNTER — Other Ambulatory Visit (HOSPITAL_COMMUNITY): Payer: Self-pay

## 2015-12-06 LAB — OB RESULTS CONSOLE GBS: GBS: NEGATIVE

## 2015-12-06 LAB — OB RESULTS CONSOLE GC/CHLAMYDIA
Chlamydia: NEGATIVE
GC PROBE AMP, GENITAL: NEGATIVE

## 2015-12-25 ENCOUNTER — Encounter (HOSPITAL_COMMUNITY): Payer: Self-pay

## 2015-12-25 ENCOUNTER — Inpatient Hospital Stay (HOSPITAL_COMMUNITY)
Admission: AD | Admit: 2015-12-25 | Discharge: 2015-12-28 | DRG: 775 | Disposition: A | Discharge status: Home / Self Care | Payer: Medicaid Other | Source: Ambulatory Visit | Attending: Family Medicine | Admitting: Family Medicine

## 2015-12-25 DIAGNOSIS — Z3A39 39 weeks gestation of pregnancy: Secondary | ICD-10-CM

## 2015-12-25 DIAGNOSIS — Z3493 Encounter for supervision of normal pregnancy, unspecified, third trimester: Secondary | ICD-10-CM

## 2015-12-25 NOTE — MAU Note (Signed)
Contractions. No LOF or bleeding

## 2015-12-25 NOTE — H&P (Signed)
LABOR AND DELIVERY ADMISSION HISTORY AND PHYSICAL NOTE  Brenda Dudley is a 35 y.o. female G3P2002 with IUP at [redacted]w[redacted]d by LMP/US  presenting for SOL.   She reports positive fetal movement. She denies leakage of fluid or vaginal bleeding.  Also denies s/sx of pre-e including HA, RUQ pain, scotomata, and photopsia.  Past obstetric hx:  1st pregnancy: term; SVD. No complications.. 2nd pregnancy: Post dates IOL resulted in c/s 2/2 failure to progress. Infant weighed 9lb6oz.  Prenatal History/Complications: Blood type: O+ ID: GBS neg, GC neg, RI, HIV NR, varicell non-immune. GTT: failed 1hr; passed 3hr. GN:FAOZ bilat hydronephrosis.  Past Medical History: No past medical history on file.  Past Surgical History: No past surgical history on file.  Obstetrical History: OB History    Gravida Para Term Preterm AB TAB SAB Ectopic Multiple Living   Social History: Social History   Social History  . Marital Status: Significant Other    Spouse Name: N/A  . Number of Children: N/A  . Years of Education: N/A   Social History Main Topics  . Smoking status: Never Smoker   . Smokeless tobacco: Not on file  . Alcohol Use: No  . Drug Use: No  . Sexual Activity: Not on file   Other Topics Concern  . Not on file   Social History Narrative    Family History: No family history on file.  Allergies: No Known Allergies  Prescriptions prior to admission  Medication Sig Dispense Refill Last Dose  . ibuprofen (ADVIL,MOTRIN) 800 MG tablet Take 1 tablet (800 mg total) by mouth 3 (three) times daily. 21 tablet 0 Unknown at Unknown time  . omeprazole (PRILOSEC) 40 MG capsule Take 1 capsule (40 mg total) by mouth daily. 30 capsule 0 Unknown at Unknown time     Review of Systems   All systems reviewed and negative except as stated in HPI  Blood pressure 120/75, pulse 88, temperature 98.6 F (37 C), resp. rate 20, height  (1.499 m), weight 158 lb 12.8 oz  (72.031 kg), last menstrual period 03/25/2015. General appearance: alert and cooperative; mild distress. Lungs: clear to auscultation bilaterally Heart: regular rate and rhythm Abdomen: soft, non-tender; bowel sounds normal Extremities: No calf swelling or tenderness Presentation: cephalic Fetal monitoring: 130/mod/+accels, no decels Uterine activity: Ctx. Dilation: 4 Effacement (%): 80 Station: -3 Exam by:: Quintella Baton RN   Prenatal labs: ABO, Rh: O/Positive/-- (10/17 0000) Antibody: Negative (10/17 0000) Rubella: Immune RPR: Nonreactive (10/17 0000)  HBsAg: Negative (10/17 0000)  HIV: Non-reactive (10/17 0000)  GBS: Negative (03/06 0000)  1 hr Glucola: failed; 3 hr passed. Genetic screening:  N/A Anatomy US: Bilateral hydronephrosis.  Prenatal Transfer Tool  Maternal Diabetes: No Genetic Screening: N/A Maternal Ultrasounds/Referrals: Abnormal:  Findings:   Fetal Kidney Anomalies Fetal Ultrasounds or other Referrals:  None Maternal Substance Abuse:  No Significant Maternal Medications:  None Significant Maternal Lab Results: Lab values include: Group B Strep negative  No results found for this or any previous visit (from the past 24 hour(s)).  There are no active problems to display for this patient.   Assessment: Brenda Dudley is a 35 y.o. G3P2002 at [redacted]w[redacted]d here for SOL  #Labor:Expectant management.  Will evaluate for augmentation with pitocin. #Pain: Declined during interview but is open to options.  Will make fentanyl and epidural at maternal request available.  #FWB: Category 1 tracing. #ID:  GBS neg; all  other neg except for varicella non immune. #MOF: Breast #MOC:IUD #Circ:  N/A  Michael J [place] 12/25/2015, 11:55 PM    OB fellow attestation: I have seen and examined this patient; I agree with above documentation in the resident's note.   Brenda Dudley is a 35 y.o. G3P2002 here for active labor  PE: BP 124/77 mmHg  Pulse 96  Temp(Src) 98.4 F  (36.9 C) (Oral)  Resp 20  Ht 4\' 11"  (1.499 m)  Wt 158 lb (71.668 kg)  BMI 31.89 kg/m2  SpO2 99%  LMP 03/25/2015 Gen: calm comfortable, NAD Resp: normal effort, no distress Abd: gravid  ROS, labs, PMH reviewed  Plan: Admit to LD for active labor Labor: expectant management. Patient desires TOLAC, CS in 2011 for FTP- infant 9#6oz FWB: Cat I ID: GBS  Neg. Needs varicella pp  Brenda FlakeKimberly Niles Hugh Kamara, MD  Family Medicine, OB Fellow 12/26/2015, 5:04 AM

## 2015-12-26 ENCOUNTER — Inpatient Hospital Stay (HOSPITAL_COMMUNITY): Payer: Medicaid Other | Admitting: Anesthesiology

## 2015-12-26 ENCOUNTER — Encounter (HOSPITAL_COMMUNITY): Payer: Self-pay

## 2015-12-26 DIAGNOSIS — Z3A39 39 weeks gestation of pregnancy: Secondary | ICD-10-CM

## 2015-12-26 DIAGNOSIS — Z3493 Encounter for supervision of normal pregnancy, unspecified, third trimester: Secondary | ICD-10-CM

## 2015-12-26 DIAGNOSIS — Z3483 Encounter for supervision of other normal pregnancy, third trimester: Secondary | ICD-10-CM | POA: Diagnosis present

## 2015-12-26 LAB — CBC
HEMATOCRIT: 38.1 % (ref 36.0–46.0)
HEMOGLOBIN: 13 g/dL (ref 12.0–15.0)
MCH: 27.7 pg (ref 26.0–34.0)
MCHC: 34.1 g/dL (ref 30.0–36.0)
MCV: 81.2 fL (ref 78.0–100.0)
Platelets: 273 10*3/uL (ref 150–400)
RBC: 4.69 MIL/uL (ref 3.87–5.11)
RDW: 14.9 % (ref 11.5–15.5)
WBC: 12.4 10*3/uL — AB (ref 4.0–10.5)

## 2015-12-26 LAB — TYPE AND SCREEN
ABO/RH(D): O POS
ANTIBODY SCREEN: NEGATIVE

## 2015-12-26 LAB — ABO/RH: ABO/RH(D): O POS

## 2015-12-26 LAB — RPR: RPR: NONREACTIVE

## 2015-12-26 MED ORDER — SODIUM CHLORIDE 0.9 % IV SOLN: 250.0000 mL | INTRAVENOUS | Status: DC | PRN

## 2015-12-26 MED ORDER — SODIUM CHLORIDE 0.9% FLUSH: 3.0000 mL | INTRAVENOUS | Status: DC | PRN

## 2015-12-26 MED ORDER — FENTANYL 2.5 MCG/ML BUPIVACAINE 1/10 % EPIDURAL INFUSION (WH - ANES)
14.0000 mL/h | INTRAMUSCULAR | Status: DC | PRN
  Administered 2015-12-26 (×2): 14 mL/h via EPIDURAL
  Administered 2015-12-26: 12 mL/h via EPIDURAL
  Filled 2015-12-26 (×2): qty 125

## 2015-12-26 MED ORDER — LIDOCAINE HCL (PF) 1 % IJ SOLN
30.0000 mL | INTRAMUSCULAR | Status: DC | PRN
  Filled 2015-12-26: qty 30

## 2015-12-26 MED ORDER — ZOLPIDEM TARTRATE 5 MG PO TABS: 5.0000 mg | ORAL_TABLET | Freq: Every evening | ORAL | Status: DC | PRN

## 2015-12-26 MED ORDER — LACTATED RINGERS IV SOLN
INTRAVENOUS | Status: DC
  Administered 2015-12-26 (×3): via INTRAVENOUS

## 2015-12-26 MED ORDER — ACETAMINOPHEN 325 MG PO TABS
650.0000 mg | ORAL_TABLET | ORAL | Status: DC | PRN
  Administered 2015-12-27: 650 mg via ORAL

## 2015-12-26 MED ORDER — SODIUM CHLORIDE 0.9% FLUSH: 3.0000 mL | Freq: Two times a day (BID) | INTRAVENOUS | Status: DC

## 2015-12-26 MED ORDER — IBUPROFEN 600 MG PO TABS
600.0000 mg | ORAL_TABLET | Freq: Four times a day (QID) | ORAL | Status: DC
  Administered 2015-12-26 – 2015-12-28 (×8): 600 mg via ORAL
  Filled 2015-12-26 (×8): qty 1

## 2015-12-26 MED ORDER — OXYTOCIN BOLUS FROM INFUSION
500.0000 mL | INTRAVENOUS | Status: DC
  Administered 2015-12-26: 500 mL via INTRAVENOUS

## 2015-12-26 MED ORDER — PHENYLEPHRINE 40 MCG/ML (10ML) SYRINGE FOR IV PUSH (FOR BLOOD PRESSURE SUPPORT): 80.0000 ug | PREFILLED_SYRINGE | INTRAVENOUS | Status: DC | PRN

## 2015-12-26 MED ORDER — EPHEDRINE 5 MG/ML INJ
10.0000 mg | INTRAVENOUS | Status: DC | PRN
  Filled 2015-12-26: qty 4

## 2015-12-26 MED ORDER — EPHEDRINE 5 MG/ML INJ: 10.0000 mg | INTRAVENOUS | Status: DC | PRN

## 2015-12-26 MED ORDER — TERBUTALINE SULFATE 1 MG/ML IJ SOLN: 0.2500 mg | Freq: Once | INTRAMUSCULAR | Status: DC | PRN

## 2015-12-26 MED ORDER — FLEET ENEMA 7-19 GM/118ML RE ENEM: 1.0000 | ENEMA | RECTAL | Status: DC | PRN

## 2015-12-26 MED ORDER — SIMETHICONE 80 MG PO CHEW: 80.0000 mg | CHEWABLE_TABLET | ORAL | Status: DC | PRN

## 2015-12-26 MED ORDER — TETANUS-DIPHTH-ACELL PERTUSSIS 5-2.5-18.5 LF-MCG/0.5 IM SUSP: 0.5000 mL | Freq: Once | INTRAMUSCULAR | Status: DC

## 2015-12-26 MED ORDER — ONDANSETRON HCL 4 MG/2ML IJ SOLN: 4.0000 mg | INTRAMUSCULAR | Status: DC | PRN

## 2015-12-26 MED ORDER — CITRIC ACID-SODIUM CITRATE 334-500 MG/5ML PO SOLN: 30.0000 mL | ORAL | Status: DC | PRN

## 2015-12-26 MED ORDER — SENNOSIDES-DOCUSATE SODIUM 8.6-50 MG PO TABS
2.0000 | ORAL_TABLET | ORAL | Status: DC
Start: 2015-12-27 — End: 2015-12-28
  Administered 2015-12-27 (×2): 2 via ORAL
  Filled 2015-12-26 (×2): qty 2

## 2015-12-26 MED ORDER — LACTATED RINGERS IV SOLN
500.0000 mL | Freq: Once | INTRAVENOUS | Status: AC
  Administered 2015-12-26: 500 mL via INTRAVENOUS

## 2015-12-26 MED ORDER — PRENATAL MULTIVITAMIN CH
1.0000 | ORAL_TABLET | Freq: Every day | ORAL | Status: DC
Start: 2015-12-27 — End: 2015-12-28
  Administered 2015-12-27 – 2015-12-28 (×2): 1 via ORAL
  Filled 2015-12-26 (×2): qty 1

## 2015-12-26 MED ORDER — WITCH HAZEL-GLYCERIN EX PADS: 1.0000 "application " | MEDICATED_PAD | CUTANEOUS | Status: DC | PRN

## 2015-12-26 MED ORDER — OXYTOCIN 10 UNIT/ML IJ SOLN
2.5000 [IU]/h | INTRAMUSCULAR | Status: DC
  Filled 2015-12-26: qty 10

## 2015-12-26 MED ORDER — LACTATED RINGERS IV SOLN: 1.0000 m[IU]/min | INTRAVENOUS | Status: DC

## 2015-12-26 MED ORDER — OXYCODONE-ACETAMINOPHEN 5-325 MG PO TABS: 2.0000 | ORAL_TABLET | ORAL | Status: DC | PRN

## 2015-12-26 MED ORDER — DIPHENHYDRAMINE HCL 25 MG PO CAPS: 25.0000 mg | ORAL_CAPSULE | Freq: Four times a day (QID) | ORAL | Status: DC | PRN

## 2015-12-26 MED ORDER — OXYCODONE-ACETAMINOPHEN 5-325 MG PO TABS: 1.0000 | ORAL_TABLET | ORAL | Status: DC | PRN

## 2015-12-26 MED ORDER — LANOLIN HYDROUS EX OINT: TOPICAL_OINTMENT | CUTANEOUS | Status: DC | PRN

## 2015-12-26 MED ORDER — PHENYLEPHRINE 40 MCG/ML (10ML) SYRINGE FOR IV PUSH (FOR BLOOD PRESSURE SUPPORT)
80.0000 ug | PREFILLED_SYRINGE | INTRAVENOUS | Status: DC | PRN
  Filled 2015-12-26: qty 20

## 2015-12-26 MED ORDER — DIPHENHYDRAMINE HCL 50 MG/ML IJ SOLN: 12.5000 mg | INTRAMUSCULAR | Status: DC | PRN

## 2015-12-26 MED ORDER — LIDOCAINE-EPINEPHRINE (PF) 2 %-1:200000 IJ SOLN
INTRAMUSCULAR | Status: DC | PRN
Start: 2015-12-26 — End: 2015-12-26
  Administered 2015-12-26: 3 mL

## 2015-12-26 MED ORDER — DIBUCAINE 1 % RE OINT: 1.0000 "application " | TOPICAL_OINTMENT | RECTAL | Status: DC | PRN

## 2015-12-26 MED ORDER — FENTANYL CITRATE (PF) 100 MCG/2ML IJ SOLN
50.0000 ug | INTRAMUSCULAR | Status: DC | PRN
  Administered 2015-12-26: 100 ug via INTRAVENOUS
  Filled 2015-12-26: qty 2

## 2015-12-26 MED ORDER — MEASLES, MUMPS & RUBELLA VAC ~~LOC~~ INJ
0.5000 mL | INJECTION | Freq: Once | SUBCUTANEOUS | Status: DC
  Filled 2015-12-26: qty 0.5

## 2015-12-26 MED ORDER — ACETAMINOPHEN 325 MG PO TABS
650.0000 mg | ORAL_TABLET | ORAL | Status: DC | PRN
  Filled 2015-12-26: qty 2

## 2015-12-26 MED ORDER — PANTOPRAZOLE SODIUM 40 MG PO TBEC: 40.0000 mg | DELAYED_RELEASE_TABLET | Freq: Every day | ORAL | Status: DC

## 2015-12-26 MED ORDER — BENZOCAINE-MENTHOL 20-0.5 % EX AERO
1.0000 "application " | INHALATION_SPRAY | CUTANEOUS | Status: DC | PRN
  Filled 2015-12-26: qty 56

## 2015-12-26 MED ORDER — LACTATED RINGERS IV SOLN
500.0000 mL | INTRAVENOUS | Status: DC | PRN
  Administered 2015-12-26: 500 mL via INTRAVENOUS

## 2015-12-26 MED ORDER — BUPIVACAINE HCL (PF) 0.25 % IJ SOLN
INTRAMUSCULAR | Status: DC | PRN
  Administered 2015-12-26 (×2): 4 mL via EPIDURAL

## 2015-12-26 MED ORDER — ONDANSETRON HCL 4 MG PO TABS: 4.0000 mg | ORAL_TABLET | ORAL | Status: DC | PRN

## 2015-12-26 MED ORDER — ONDANSETRON HCL 4 MG/2ML IJ SOLN: 4.0000 mg | Freq: Four times a day (QID) | INTRAMUSCULAR | Status: DC | PRN

## 2015-12-26 NOTE — Anesthesia Preprocedure Evaluation (Signed)

## 2015-12-26 NOTE — Progress Notes (Signed)
Assisted Pediatrician and RN with interpretation of assessments.  Spanish Interpreter

## 2015-12-26 NOTE — Anesthesia Procedure Notes (Signed)
Epidural Patient location during procedure: OB  Staffing Anesthesiologist: Kateleen Encarnacion Performed by: anesthesiologist   Preanesthetic Checklist Completed: patient identified, surgical consent, pre-op evaluation, timeout performed, IV checked, risks and benefits discussed and monitors and equipment checked  Epidural Patient position: sitting Prep: DuraPrep Patient monitoring: heart rate, cardiac monitor, continuous pulse ox and blood pressure Approach: midline Location: L3-L4 Injection technique: LOR saline  Needle:  Needle type: Tuohy  Needle gauge: 17 G Needle length: 9 cm Needle insertion depth: 8 cm Catheter type: closed end flexible Catheter size: 19 Gauge Catheter at skin depth: 13 cm Test dose: negative and 2% lidocaine with Epi 1:200 K  Assessment Events: blood not aspirated, injection not painful, no injection resistance, negative IV test and no paresthesia  Additional Notes Reason for block:procedure for pain   

## 2015-12-26 NOTE — Progress Notes (Signed)
Labor Progress Note Brenda Dudley is a 35 y.o. G3P2002 at 3158w3d presented for active labor TOLAC. S:  Patient comfortable with epidural in place, feeling contractions.  O:  BP 111/77 mmHg  Pulse 89  Temp(Src) 98.4 F (36.9 C) (Oral)  Resp 18  Ht 4\' 11"  (1.499 m)  Wt 71.668 kg (158 lb)  BMI 31.89 kg/m2  SpO2 98%  LMP 03/25/2015 EFM: 140/moderate/accels+/decels-  CVE: Dilation: 7 Effacement (%): 80 Cervical Position: Posterior Station: -1, -2 Presentation: Vertex Exam by:: Smith RN   A&P: 35 y.o. [id number] 8258w3d active labor TOLAC. #Labor: monitor cervical change q2h #Pain: continue epidural infusion #FWB: Category 1   Lucretia KernJohn Diehl, Med Student 8:55 AM

## 2015-12-26 NOTE — Progress Notes (Signed)
Assisted RN with interpretation of admit to new unit.  Spanish Interpreter

## 2015-12-26 NOTE — Progress Notes (Signed)
Assisted RN with interpretation of medical information Spanish interpreter

## 2015-12-26 NOTE — Progress Notes (Signed)
Assisted Lactation with interpretation of medical information and breast feeding instructions. Spanish Interpreter

## 2015-12-26 NOTE — Lactation Note (Signed)
This note was copied from a baby's chart. Lactation Consultation Note  Interpreter Benita used for Spanish. P3, Ex BF 5 months and 8 months Baby latched in side lying upon entering. Demonstrated how to do hand expression, good flow of drops expressed. Mother states she had a hard area on her breast approx 13 years ago that was red and she had a fever, it had to be opened up and she was treated with antibiotics.  Possible mastitis. Discussed ways to prevent mastitis and provided mother with manual pump. Reviewed basics including cluster feeding. Mom encouraged to feed baby 8-12 times/24 hours and with feeding cues.  Mom made aware of O/P services, breastfeeding support groups, community resources, and our phone # for post-discharge questions in Spanish.     Patient Name: Brenda Dudley ZOXWR'UToday's Date: 12/26/2015     Maternal Data    Feeding Feeding Type: Breast Fed Length of feed: 15 min  LATCH Score/Interventions Latch: Grasps breast easily, tongue down, lips flanged, rhythmical sucking.  Audible Swallowing: A few with stimulation  Type of Nipple: Everted at rest and after stimulation  Comfort (Breast/Nipple): Soft / non-tender     Hold (Positioning): No assistance needed to correctly position infant at breast.  LATCH Score: 9  Lactation Tools Discussed/Used     Consult Status      Dahlia ByesBerkelhammer, Clemence Lengyel [facility] 12/26/2015, 7:20 PM

## 2015-12-27 NOTE — Discharge Summary (Signed)
OB Discharge Summary  Patient Name: Brenda Dudley DOB: 14-Jul-1981 MRN: [id number]  Date of admission: 12/25/2015 Delivering MD: Thomasena Edis   Date of discharge: 12/27/2015  Admitting diagnosis: 39 WKS, CTXS Intrauterine pregnancy: [redacted]w[redacted]d     Secondary diagnosis:Active Problems:   Normal pregnancy in third trimester  Additional problems:none     Discharge diagnosis: Term Pregnancy Delivered and VBAC                                                                     Post partum procedures:none  Augmentation: Pitocin  Complications: None  Hospital course:  Onset of Labor With Vaginal Delivery     35 y.o. yo G3P3003 at [redacted]w[redacted]d was admitted in Active Labor on 12/25/2015. Patient had an uncomplicated labor course as follows:  Membrane Rupture Time/Date: 9:40 AM ,12/26/2015   Intrapartum Procedures: Episiotomy: None [1]                                         Lacerations:  1st degree [2];Perineal [11];Labial [10]  Patient had a delivery of a Viable infant. 12/26/2015  Information for the patient's newborn:  Amaranta, Mehl [[id number]]  Delivery Method: Vaginal, Spontaneous Delivery (Filed from Delivery Summary)    Pateint had an uncomplicated postpartum course.  She is ambulating, tolerating a regular diet, passing flatus, and urinating well. Patient is discharged home in stable condition on 12/27/2015.    Physical exam  Filed Vitals:   12/26/15 1530 12/26/15 1625 12/26/15 2040 12/27/15 0608  BP: 113/69 110/60 104/62 92/65  Pulse: 79 75 75 64  Temp: 98.6 F (37 C) 98.3 F (36.8 C) 98.6 F (37 C) 98.3 F (36.8 C)  TempSrc: Oral Oral Oral Axillary  Resp: Height:      Weight:      SpO2:   99%    General: alert, cooperative and no distress Lochia: appropriate Uterine Fundus: firm Incision: N/A DVT Evaluation: No evidence of DVT seen on physical exam. Negative Homan's sign. No cords or calf tenderness. Labs: Lab Results  Component Value  Date   WBC 12.4* 12/26/2015   HGB 13.0 12/26/2015   HCT 38.1 12/26/2015   MCV 81.2 12/26/2015   PLT 273 12/26/2015   CMP Latest Ref Rng 10/24/2014  Glucose 70 - 99 mg/dL 94  BUN 6 - 23 mg/dL 8  Creatinine 7.90 - 2.40 mg/dL 9.73  Sodium 532 - 992 mmol/L 136  Potassium 3.5 - 5.1 mmol/L 3.6  Chloride 96 - 112 mmol/L 100  CO2 19 - 32 mmol/L 25  Calcium 8.4 - 10.5 mg/dL 9.6  Total Protein 6.0 - 8.3 g/dL 8.1  Total Bilirubin 0.3 - 1.2 mg/dL 4.2(A)  Alkaline Phos 39 - 117 U/L 51  AST 0 - 37 U/L 28  ALT 0 - 35 U/L 23    Discharge instruction: per After Visit Summary and "Baby and Me Booklet".  After Visit Meds:    Medication List    Notice    You have not been prescribed any medications.      Diet: routine diet  Activity: Advance as  tolerated. Pelvic rest for 6 weeks.   Outpatient follow up:6 weeks Follow up Appt:No future appointments. Follow up visit: No Follow-up on file.  Postpartum contraception: IUD Paragard  Newborn Data: Live born female  Birth Weight: 7 lb 7.6 oz (3390 g) APGAR: 8, 9  Baby Feeding: Breast Disposition:home with mother   12/27/2015 Ferdie PingLAWSON, Mary-Anne Polizzi DARLENE, CNM

## 2015-12-27 NOTE — Progress Notes (Signed)
Assisted RN with information about phototherapy, I ordered her meals, by Orlan LeavensViria Alvarez Spanish Interpreter.

## 2015-12-27 NOTE — Progress Notes (Signed)
UR chart review completed.  

## 2015-12-27 NOTE — Anesthesia Postprocedure Evaluation (Signed)
Anesthesia Post Note  Patient: Brenda Dudley  Procedure(s) Performed: * No procedures listed *  Patient location during evaluation: Mother Baby Anesthesia Type: Epidural Level of consciousness: oriented, awake and alert and awake Pain management: pain level controlled Vital Signs Assessment: post-procedure vital signs reviewed and stable Respiratory status: spontaneous breathing Cardiovascular status: stable Postop Assessment: patient able to bend at knees, no signs of nausea or vomiting and adequate PO intake Anesthetic complications: no    Last Vitals:  Filed Vitals:   12/26/15 2040 12/27/15 0608  BP: 104/62 92/65  Pulse: 75 64  Temp: 37 C 36.8 C  Resp: 20 18    Last Pain:  Filed Vitals:   12/27/15 0628  PainSc: 3                  Hakop Humbarger Hristova

## 2015-12-27 NOTE — Lactation Note (Signed)
This note was copied from a baby's chart. Lactation Consultation Note Follow up visit at 28 hours of age.  Baby has started on phototherapy and is under a bank light.  Baby is fussy and mom is comforting with paci.  Mom reports baby just had feeding at the breast and bottle of 16mls and burped well.   Mom is latching baby to breast prior to bottle feedings.  Mom does report a little pain with latch, but declines assist.  Mom to call for assist as needed.   Patient Name: Brenda Dudley'UToday's Date: 12/27/2015 Reason for consult: Follow-up assessment;Hyperbilirubinemia   Maternal Data    Feeding Feeding Type: Bottle Fed - Formula Length of feed: 30 min  LATCH Score/Interventions Latch: Repeated attempts needed to sustain latch, nipple held in mouth throughout feeding, stimulation needed to elicit sucking reflex. (sleepy)  Audible Swallowing: A few with stimulation  Type of Nipple: Everted at rest and after stimulation  Comfort (Breast/Nipple): Soft / non-tender     Hold (Positioning): Assistance needed to correctly position infant at breast and maintain latch.  LATCH Score: 7  Lactation Tools Discussed/Used     Consult Status Consult Status: Follow-up Date: 12/28/15 Follow-up type: In-patient    Shoptaw, Arvella MerlesJana Lynn 12/27/2015, 6:13 PM

## 2015-12-28 MED ORDER — SENNOSIDES-DOCUSATE SODIUM 8.6-50 MG PO TABS: 2.0000 | ORAL_TABLET | Freq: Two times a day (BID) | ORAL | Status: DC | PRN

## 2015-12-28 MED ORDER — PRENATAL MULTIVITAMIN CH: 1.0000 | ORAL_TABLET | Freq: Every day | ORAL | Status: DC

## 2015-12-28 MED ORDER — IBUPROFEN 600 MG PO TABS: 600.0000 mg | ORAL_TABLET | Freq: Four times a day (QID) | ORAL | Status: DC

## 2015-12-28 NOTE — Discharge Instructions (Signed)
Parto vaginal, Cuidados posteriores  °(Vaginal Delivery, Care After) °Siga estas instrucciones durante las próximas semanas. Estas indicaciones para el alta le proporcionan información general acerca de cómo deberá cuidarse después del parto. El médico también podrá darle instrucciones específicas. El tratamiento ha sido planificado según las prácticas médicas actuales, pero en algunos casos pueden ocurrir problemas. Comuníquese con el médico si tiene algún problema o tiene preguntas al volver a su casa.  °INSTRUCCIONES PARA EL CUIDADO EN EL HOGAR  °· Tome sólo medicamentos de venta libre o recetados, según las indicaciones del médico o del farmacéutico. °· No beba alcohol, especialmente si está amamantando o toma analgésicos. °· No mastique tabaco ni fume. °· No consuma drogas. °· Continúe con un adecuado cuidado perineal. El buen cuidado perineal incluye: °¨ Higienizarse de adelante hacia atrás. °¨ Mantener la zona perineal limpia. °· No use tampones ni duchas vaginales hasta que su médico la autorice. °· Dúchese, lávese el cabello y tome baños de inmersión según las indicaciones de su médico. °· Utilice un sostén que le ajuste bien y que brinde buen soporte a sus mamas. °· Consuma alimentos saludables. °· Beba suficiente líquido para mantener la orina clara o de color amarillo pálido. °· Consuma alimentos ricos en fibra como cereales y panes integrales, arroz, frijoles y frutas y verduras frescas todos los días. Estos alimentos pueden ayudarla a prevenir o aliviar el estreñimiento. °· Siga las recomendaciones de su médico relacionadas con la reanudación de actividades como subir escaleras, conducir automóviles, levantar objetos, hacer ejercicios o viajar. °· Hable con su médico acerca de reanudar la actividad sexual. Volver a la actividad sexual depende del riesgo de infección, la velocidad de la curación y la comodidad y su deseo de reanudarla. °· Trate de que alguien la ayude con las actividades del hogar y con  el recién nacido al menos durante un par de días después de salir del hospital. °· Descanse todo lo que pueda. Trate de descansar o tomar una siesta mientras el bebé está durmiendo. °· Aumente sus actividades gradualmente. °· Cumpla con todas las visitas de control programadas para después del parto. Es muy importante asistir a todas las citas programadas de seguimiento. En estas citas, su médico va a controlarla para asegurarse de que esté sanando física y emocionalmente. °SOLICITE ATENCIÓN MÉDICA SI:  °· Elimina coágulos grandes por la vagina. Guarde algunos coágulos para mostrarle al médico. °· Tiene una secreción con feo olor que proviene de la vagina. °· Tiene dificultad para orinar. °· Orina con frecuencia. °· Siente dolor al orinar. °· Nota un cambio en sus movimientos intestinales. °· Aumenta el enrojecimiento, el dolor o la hinchazón en la zona de la incisión vaginal (episiotomía) o el desgarro vaginal. °· Tiene pus que drena por la episiotomía o el desgarro vaginal. °· La episiotomía o el desgarro vaginal se abren. °· Sus mamas le duelen, están duras o enrojecidas. °· Sufre un dolor intenso de cabeza. °· Tiene visión borrosa o ve manchas. °· Se siente triste o deprimida. °· Tiene pensamientos acerca de lastimarse o dañar al recién nacido. °· Tiene preguntas acerca de su cuidado personal, el cuidado del recién nacido o acerca de los medicamentos. °· Se siente mareada o sufre un desmayo. °· Tiene una erupción. °· Tiene náuseas o vómitos. °· Usted amamantó al bebé y no ha tenido su período menstrual dentro de las 12 semanas después de dejar de amamantar. °· No amamanta al bebé y no tuvo su período menstrual en las últimas 12° semanas después del   parto. °· Tiene fiebre. °SOLICITE ATENCIÓN MÉDICA DE INMEDIATO SI:  °· Siente dolor persistente. °· Siente dolor en el pecho. °· Le falta el aire. °· Se desmaya. °· Siente dolor en la pierna. °· Siente dolor en el estómago. °· El sangrado vaginal satura dos o más  apósitos en 1 hora. °  °Esta información no tiene como fin reemplazar el consejo del médico. Asegúrese de hacerle al médico cualquier pregunta que tenga. °  °Document Released: 09/18/2005 Document Revised: 06/09/2015 °Elsevier Interactive Patient Education ©2016 [employer] Inc[employer] ° °

## 2015-12-28 NOTE — Discharge Summary (Signed)
OB Discharge Summary     Patient Name: Brenda Dudley DOB: July 15, 1981 MRN: [id number]  Date of admission: 12/25/2015 Delivering MD: Thomasena EdisIEHL, JOHN N   Date of discharge: 12/28/2015  Admitting diagnosis: 39 WKS, CTXS Intrauterine pregnancy: 646w3d     Secondary diagnosis:  Active Problems:   Normal pregnancy in third trimester  Additional problems: none     Discharge diagnosis: Term Pregnancy Delivered and VBAC                                                                                                Post partum procedures:None  Augmentation: None  Complications: None  Hospital course:  Onset of Labor With Vaginal Delivery     35 y.o. yo G3P3003 at 726w3d was admitted in Active Labor on 12/25/2015. Patient had an uncomplicated labor course as follows:  Membrane Rupture Time/Date: 9:40 AM ,12/26/2015   Intrapartum Procedures: Episiotomy: None [1]                                         Lacerations:  1st degree [2];Perineal [11];Labial [10]  Patient had a delivery of a Viable infant. 12/26/2015  Information for the patient's newborn:  Nani GasserMartinez, Girl Idalee [[id number]][[id number]]  Delivery Method: Vaginal, Spontaneous Delivery (Filed from Delivery Summary)    Pateint had an uncomplicated postpartum course.  She is ambulating, tolerating a regular diet, passing flatus, and urinating well. Patient is discharged home in stable condition on 12/28/2015.    Physical exam  Filed Vitals:   12/26/15 2040 12/27/15 0608 12/27/15 1700 12/28/15 0552  BP: 104/62 92/65 93/55  122/82  Pulse: 75 64 93 79  Temp: 98.6 F (37 C) 98.3 F (36.8 C) 98.3 F (36.8 C) 97.9 F (36.6 C)  TempSrc: Oral Axillary  Oral  Resp: 20 18 18 18   Height:      Weight:      SpO2: 99%      General: alert, cooperative and no distress Lochia: appropriate Uterine Fundus: firm Incision: N/A DVT Evaluation: No evidence of DVT seen on physical exam. Labs: Lab Results  Component Value Date   WBC 12.4* 12/26/2015   HGB 13.0 12/26/2015   HCT 38.1 12/26/2015   MCV 81.2 12/26/2015   PLT 273 12/26/2015   CMP Latest Ref Rng 10/24/2014  Glucose 70 - 99 mg/dL 94  BUN 6 - 23 mg/dL 8  Creatinine 2.950.50 - 2.841.10 mg/dL 1.320.63  Sodium 440135 - 102145 mmol/L 136  Potassium 3.5 - 5.1 mmol/L 3.6  Chloride 96 - 112 mmol/L 100  CO2 19 - 32 mmol/L 25  Calcium 8.4 - 10.5 mg/dL 9.6  Total Protein 6.0 - 8.3 g/dL 8.1  Total Bilirubin 0.3 - 1.2 mg/dL 7.2(Z1.4(H)  Alkaline Phos 39 - 117 U/L 51  AST 0 - 37 U/L 28  ALT 0 - 35 U/L 23    Discharge instruction: per After Visit Summary and "Baby and Me Booklet".  After visit meds:    Medication List    TAKE these medications  ibuprofen 600 MG tablet  Commonly known as:  ADVIL,MOTRIN  Take 1 tablet (600 mg total) by mouth every 6 (six) hours.     prenatal multivitamin Tabs tablet  Take 1 tablet by mouth daily at 12 noon.     senna-docusate 8.6-50 MG tablet  Commonly known as:  Senokot-S  Take 2 tablets by mouth 2 (two) times daily as needed for mild constipation or moderate constipation.        Diet: routine diet  Activity: Advance as tolerated. Pelvic rest for 6 weeks.   Outpatient follow up:6 weeks Follow up Appt:No future appointments. Follow up Visit:No Follow-up on file.  Postpartum contraception: IUD paragard  Newborn Data: Live born female  Birth Weight: 7 lb 7.6 oz (3390 g) APGAR: 8, 9  Baby Feeding: Breast Disposition:rooming in   [place], DO 12/28/2015, 7:51 AM PGY-2, [facility]    APP attestation:  I have seen and examined this patient; I agree with above documentation in the Resident's note.   Brenda Dudley is a 35 y.o. G3P3003   PE: BP 122/82 mmHg  Pulse 79  Temp(Src) 97.9 F (36.6 C) (Oral)  Resp 18  Ht  (1.499 m)  Wt 158 lb (71.668 kg)  BMI 31.89 kg/m2  SpO2 99%  LMP 03/25/2015  Breastfeeding? Unknown Gen: calm comfortable, NAD Resp: normal effort, no distress Abd: gravid  ROS, labs,  PMH reviewed  Plan: Discharge   Brenda Dudley 12/29/2015, 9:15 AM

## 2015-12-31 ENCOUNTER — Ambulatory Visit (HOSPITAL_COMMUNITY)
Admission: RE | Admit: 2015-12-31 | Discharge: 2015-12-31 | Disposition: A | Discharge status: Home / Self Care | Payer: Self-pay | Source: Ambulatory Visit | Attending: Obstetrics & Gynecology | Admitting: Obstetrics & Gynecology

## 2015-12-31 NOTE — Lactation Note (Signed)
Lactation Consult  Mother's reason for visit:  [facility], engorgement and sore nipples. Visit Type:  Outpatient Appointment Notes:  See below Consult:  Initial Lactation Consultant:  Geralynn OchsWILLIARD, Dmetrius Ambs   Baby's weight today, taken twice, 7 lbs 3.8 oz (birth weight 7 lbs 7 oz)  Mom reports that her milk came in 12/29/15 and mom has been having difficulty latching baby since the 29th and breasts are significantly engorged today. Assisted mom to latch baby, but baby not able to latch due to mom's breast fullness. Demonstrated football position with mom. Assisted mom with latching breasts and use of DEBP. Mom able to pump 15 ml of EBM which were given to baby by bottle. Plan is for mom to continue to ice, massage and use DEBP until breast softened. [facility] DEBP. Enc mom to put baby to breast when her breasts are soft.   Baby appears to have a tight lingual and labial frenulum. However, not able to assess latch due to breast engorgement.   [facility] and a follow-up Lactation outpatient appointment on 01-04-16 at 4 pm to assist with latching baby directly to breast.   ________________________________________________________________________ Baby's Name: Jacklynn BueAntonia Martinez Date of Birth: 1981/01/10 [facility] Gender: female Gestational Age: <6142w3d> (At Birth) Birth Weight:7lb 7oz  Weight at Discharge: 7lb 4ozDate of Discharge: 12-19-15 There were no vitals filed for this visit. [facility]  6lb 10oz  Location:{[facility] Weight today: 7 lbs 3.8 oz   ________________________________________________________________________  Mother's Name: [name] Type of delivery:  Vaginal Breastfeeding Experience:  5  mo.s and 8 mo.s Maternal Medical Conditions:  none Maternal Medications:  Ibuprofen PRN  ________________________________________________________________________  Breastfeeding History (Post Discharge)  Frequency of breastfeeding:  5-6 Duration of feeding:  10-15 minutes each side.   Supplementation  Formula:  Volume 60ml Frequency:  2 Total volume per day:  120 ml       Brand: Enfamil   Infant Intake and Output Assessment  Voids:  2 in 24 hrs.  Color:  Clear yellow Stools:  3 in 24 hrs.  Color:  Yellow  ________________________________________________________________________  Maternal Breast Assessment  Breast:  Engorged and Non-compressible Nipple:  Erect Pain level:  6 Pain interventions:  Cold packs  _______________________________________________________________________ Feeding Assessment/Evaluation  Initial feeding assessment:  Infant's oral assessment:  Variance Baby has a tight lingual and labial frenulum. However, not able to achieve a latch at breast during this appointment because mom's breasts are significantly engorged.     Positioning:  Football Left breast  LATCH documentation:  Latch:  0 = Too sleepy or reluctant, no latch achieved, no sucking elicited.  Audible swallowing:  0 = None  Type of nipple:  2 = Everted at rest and after stimulation  Comfort (Breast/Nipple):  0 = Engorged, cracked, bleeding, large blisters, severe discomfort  Hold (Positioning):  1 = Assistance needed to correctly position infant at breast and maintain latch  LATCH score:  3    Tools:  Pump and Bottle  Instructed on use and cleaning of tool:  Yes.    Total amount pumped post feed:  R 7 ml    L 8 ml  Total amount transferred:  0 ml Total supplement given:  15 ml of EBM, and 30 ml of formula

## 2016-01-04 ENCOUNTER — Ambulatory Visit (HOSPITAL_COMMUNITY)
Admission: RE | Admit: 2016-01-04 | Discharge: 2016-01-04 | Disposition: A | Discharge status: Home / Self Care | Payer: Self-pay | Source: Ambulatory Visit | Attending: Family Medicine | Admitting: Family Medicine

## 2016-01-04 NOTE — Lactation Note (Signed)
Lactation Consult  Mother's reason for visit:  Follow up on engorgement Visit Type:  Outpatient Appointment Notes:  See below Consult:  Follow-Up Lactation Consultant:  Brenda Dudley, Brenda Dudley  ________________________________________________________________________ Brenda FloresBaby's Name: Brenda Dudley Date of Birth: [birth year] [facility] Gender: female Gestational Age: term Birth Weight: 7 lbs 7 oz Weight at Discharge: Weight: 2528 ozDate of Discharge: [facility]   [facility] 2240 12/26/15 0113  Weight: 2540.8 oz 2528 oz   Weight today: 7 lbs 7.5 oz (increase of 4 oz in 4 days)       Brenda Dudley comes back today for follow up after her appointment 4 days ago.  She was engorged at 4 days pp, and baby was unable to latch.  [facility], and gave her instructions alternating icing her breasts, and pumping regularly to resolve the engorgement.  Mom states she only pumped one time, and obtained only 1 oz.  She said her breasts softened and she breast fed baby every 2-3 hrs.  She is giving baby 3-4 bottles of formula per day, as baby doesn't seem satisfied at some feedings.  Mom denies any nipple soreness.  Breasts are still firm, not engorged, but full ducts palpated, milk dripping easily. Watched Mom latch baby in cradle hold, without using support under baby.  Mom hunched over baby.  Baby latched shallow, and became non nutritive immediately.  I showed Mom how to changed to cross cradle hold, and control the latch to facilitate a deeper areolar grasp.  When baby was latched more deeply, she still was non-nutritive, and milk transfer was minimal.  Spent time with Mom teaching her ways to keep baby awake at the breast.  Baby fed on both breasts, and continued with mainly non-nutritive sucking.  Baby transferred only 22 ml.  Baby still crying after coming off the breast. Gave Mom a sample formula  bottle to feed baby, but she declined to give it to her.  Made sure Mom was aware that if baby isn't transferring milk from breast in adequate amounts, that it was imperative she supplement with formula.    Plan- -[facility] Mom states she will call tomorrow -Breast feed often using cross cradle hold, rather than cradle.  Feed baby skin to skin to keep her awake -Follow breast feeding with 1-2 oz of EBM+/formula by bottle -Pump both breasts 15-20 minutes to support her milk supply -encouraged Mom to receive help via WIC, or call us prn.     ________________________________________________________________________  Mother's Name: Brenda Dudley Type of delivery:   Breastfeeding Experience:  1st baby 5 months, 2nd baby 8 months Maternal Medical Conditions:  none Maternal Medications:  PNV  ________________________________________________________________________  Breastfeeding History (Post Discharge)  Frequency of breastfeeding:  Every 2-3 hrs Duration of feeding:  15-20 mins  Supplementation  Formula:  Volume 60ml Frequency:  3-4 times a day Total volume per day:  240 ml       Brand: Enfamil   Method:  Bottle,   Pumping  Type of pump:  Manual Frequency:  2 times since 3/31 Volume:  30 ml  Infant Intake and Output Assessment  Voids:  6in 24 hrs.  Color:  Clear yellow Stools:  4 in 24 hrs.  Color:  Yellow  ________________________________________________________________________  Maternal Breast Assessment  Breast:  Full Nipple:  Erect Pain level:  0   _______________________________________________________________________ Feeding Assessment/Evaluation  Initial feeding assessment:  Infant's oral assessment:  Variance  Positioning:  Cross cradle Right breast  LATCH documentation:  Latch:  2 = Grasps breast easily, tongue down, lips flanged, rhythmical sucking.  Audible swallowing:  1 = A few with stimulation  Type of nipple:  2 = Everted at  rest and after stimulation  Comfort (Breast/Nipple):  2 = Soft / non-tender  Hold (Positioning):  1 = Assistance needed to correctly position infant at breast and maintain latch  LATCH score:  8  Attached assessment:  Deep  Lips flanged:  Yes.    Lips untucked:  No.  Suck assessment:  Nonnutritive  Tools:  Pump Instructed on use and cleaning of tool:  Yes.    Pre-feed weight:  3388 g  Post-feed weight:  3404 g Amount transferred:  16ml  Additional Feeding Assessment -   Infant's oral assessment:  Variance  Positioning:  Cross cradle Left breast  LATCH documentation:  Latch:  2 = Grasps breast easily, tongue down, lips flanged, rhythmical sucking.  Audible swallowing:  1 = A few with stimulation  Type of nipple:  2 = Everted at rest and after stimulation  Comfort (Breast/Nipple):  2 = Soft / non-tender  Hold (Positioning):  1 = Assistance needed to correctly position infant at breast and maintain latch  LATCH score:  8  Attached assessment:  Deep  Lips flanged:  Yes.    Lips untucked:  No.  Suck assessment:  Displays both   Pre-feed weight:  3404 g  Post-feed weight:  3410 g  Amount transferred: 6 ml  Total amount transferred:  22 ml Total supplement given:  0 ml

## 2017-02-13 ENCOUNTER — Encounter: Payer: Self-pay | Admitting: Internal Medicine

## 2017-02-13 ENCOUNTER — Ambulatory Visit: Payer: Self-pay | Attending: Internal Medicine | Admitting: Internal Medicine

## 2017-02-13 VITALS — BP 106/73 | HR 79 | Temp 98.0°F | Resp 16 | Ht 59.0 in | Wt 133.6 lb

## 2017-02-13 DIAGNOSIS — Z23 Encounter for immunization: Secondary | ICD-10-CM

## 2017-02-13 DIAGNOSIS — M79601 Pain in right arm: Secondary | ICD-10-CM | POA: Insufficient documentation

## 2017-02-13 DIAGNOSIS — M549 Dorsalgia, unspecified: Secondary | ICD-10-CM

## 2017-02-13 DIAGNOSIS — M79602 Pain in left arm: Secondary | ICD-10-CM | POA: Insufficient documentation

## 2017-02-13 DIAGNOSIS — M546 Pain in thoracic spine: Secondary | ICD-10-CM | POA: Insufficient documentation

## 2017-02-13 MED ORDER — CYCLOBENZAPRINE HCL 5 MG PO TABS: 5.0000 mg | ORAL_TABLET | Freq: Two times a day (BID) | ORAL | 1 refills | Status: DC | PRN

## 2017-02-13 MED ORDER — NAPROXEN 500 MG PO TABS: 500.0000 mg | ORAL_TABLET | Freq: Two times a day (BID) | ORAL | 0 refills | Status: DC | PRN

## 2017-02-13 NOTE — Progress Notes (Signed)
Patient ID: Brenda Dudley, female    DOB: 1980-11-22  MRN: [id number]  CC: Back Pain and Shoulder Pain   Subjective: Brenda Dudley is a 36 y.o. female who presents for new pt visit Her concerns today include:  1. C/o pain in both arms x few wks; -deep and stabbing in nature -constant; relieved with Tylenol or Ibuprofen for a few hrs -nothing makes it worse -some numbness in LT arm at nights. No neck pain, some discomfort in LT shoulder with movement  2. Pain in LT upper and middle back x 1 yr. -more intense over past few mths -no initiating or aggravating factors  Patient Active Problem List   Diagnosis Date Noted  . Normal pregnancy in third trimester 12/26/2015     Current Outpatient Prescriptions on File Prior to Visit  Medication Sig Dispense Refill  . Prenatal Vit-Fe Fumarate-FA (PRENATAL MULTIVITAMIN) TABS tablet Take 1 tablet by mouth daily at 12 noon. (Patient not taking: Reported on 02/13/2017) 30 tablet 1  . senna-docusate (SENOKOT-S) 8.6-50 MG tablet Take 2 tablets by mouth 2 (two) times daily as needed for mild constipation or moderate constipation. (Patient not taking: Reported on 02/13/2017) 30 tablet 0   No current facility-administered medications on file prior to visit.     No Known Allergies  Social History   Social History  . Marital status: Media planner    Spouse name: N/A  . Number of children: N/A  . Years of education: N/A   Occupational History  . Not on file.   Social History Main Topics  . Smoking status: Never Smoker  . Smokeless tobacco: Never Used  . Alcohol use No  . Drug use: No  . Sexual activity: Not on file   Other Topics Concern  . Not on file   Social History Narrative  . No narrative on file    No family history on file.  No past surgical history on file.  ROS: Review of Systems  Constitutional: Negative.     PHYSICAL EXAM: BP 106/73 (BP Location: Right Arm, Patient Position: Sitting, Cuff Size:  Normal)   Pulse 79   Temp 98 F (36.7 C) (Oral)   Resp 16   Ht 4\' 11"  (1.499 m)   Wt 133 lb 9.6 oz (60.6 kg)   SpO2 98%   BMI 26.98 kg/m   General appearance - alert, well appearing, and in no distress Mental status - alert, oriented to person, place, and time, normal mood, behavior, speech, dress, motor activity, and thought processes Neck - supple, no significant adenopathy Chest - clear to auscultation, no wheezes, rales or rhonchi, symmetric air entry Neurological - power 5/5 in UEs, gross sensation intact UEs.  Musculoskeletal - mild discomfort on passive ROM LT shoulder. Drop arm test negative. No tenderness on palpation of thoracic spine. Mild tightness in LT upper paraspinal muscles  ASSESSMENT AND PLAN: 1. Bilateral arm pain -mild radicular symptoms on LT - naproxen (NAPROSYN) 500 MG tablet; Take 1 tablet (500 mg total) by mouth 2 (two) times daily as needed.  Dispense: 30 tablet; Refill: 0 - cyclobenzaprine (FLEXERIL) 5 MG tablet; Take 1 tablet (5 mg total) by mouth 2 (two) times daily as needed for muscle spasms.  Dispense: 20 tablet; Refill: 1  2. Mid back pain on left side -massages PRN - naproxen (NAPROSYN) 500 MG tablet; Take 1 tablet (500 mg total) by mouth 2 (two) times daily as needed.  Dispense: 30 tablet; Refill: 0 - cyclobenzaprine (FLEXERIL) 5  MG tablet; Take 1 tablet (5 mg total) by mouth 2 (two) times daily as needed for muscle spasms.  Dispense: 20 tablet; Refill: 1 Pt advised that Flexeril can cause drowsiness  3. Need for Tdap vaccination - Tdap vaccine greater than or equal to 7yo IM   Patient was given the opportunity to ask questions.  Patient verbalized understanding of the plan and was able to repeat key elements of the plan.  [employer] interpreter used during this encounter.  Orders Placed This Encounter  Procedures  . Tdap vaccine greater than or equal to 7yo IM     Requested Prescriptions   Signed Prescriptions Disp Refills  . naproxen  (NAPROSYN) 500 MG tablet 30 tablet 0    Sig: Take 1 tablet (500 mg total) by mouth 2 (two) times daily as needed.  . cyclobenzaprine (FLEXERIL) 5 MG tablet 20 tablet 1    Sig: Take 1 tablet (5 mg total) by mouth 2 (two) times daily as needed for muscle spasms.    Return in about 1 month (around 03/16/2017) for physical.  Pt to sign release for us to get results of pap smear from [facility] that she thinks she had done 2-3 yrs ago.  Jonah Blueeborah Lewi Drost, MD, FACP

## 2017-02-13 NOTE — Progress Notes (Signed)
Interpreter  [phone], Brenda Dudley

## 2017-02-13 NOTE — Patient Instructions (Signed)
Please have patient sign a release and request PAP smear report from [facility].

## 2017-03-20 ENCOUNTER — Ambulatory Visit: Payer: Self-pay | Attending: Internal Medicine | Admitting: Internal Medicine

## 2017-03-20 ENCOUNTER — Encounter: Payer: Self-pay | Admitting: Internal Medicine

## 2017-03-20 VITALS — BP 112/78 | HR 77 | Temp 98.2°F | Resp 16 | Wt 134.0 lb

## 2017-03-20 DIAGNOSIS — Z0000 Encounter for general adult medical examination without abnormal findings: Secondary | ICD-10-CM

## 2017-03-20 DIAGNOSIS — H538 Other visual disturbances: Secondary | ICD-10-CM

## 2017-03-20 DIAGNOSIS — E663 Overweight: Secondary | ICD-10-CM

## 2017-03-20 DIAGNOSIS — Z131 Encounter for screening for diabetes mellitus: Secondary | ICD-10-CM

## 2017-03-20 DIAGNOSIS — Z79899 Other long term (current) drug therapy: Secondary | ICD-10-CM | POA: Insufficient documentation

## 2017-03-20 DIAGNOSIS — Z124 Encounter for screening for malignant neoplasm of cervix: Secondary | ICD-10-CM | POA: Insufficient documentation

## 2017-03-20 LAB — POCT GLYCOSYLATED HEMOGLOBIN (HGB A1C): HEMOGLOBIN A1C: 5.4

## 2017-03-20 NOTE — Progress Notes (Signed)
Patient ID: Brenda Dudley, female    DOB: 1981/02/22  MRN: [id number]  CC: Gynecologic Exam and Annual Exam   Subjective: Brenda Dudley is a 36 y.o. female who presents for physical Her concerns today include:  1. Seen last mth for pain in arms/back. doing better with meds which she does not have to take every day  2. Here today for physical and agreeable to [place] doing pap since we did not get last pap report from [facility]. -no abnormal paps in past. -no vaginal discgh or itching -has IUD - mirena.  Does not have regular menses with this. -sexually active with one partner  Patient Active Problem List   Diagnosis Date Noted  . Diabetes mellitus screening 03/20/2017  . Normal pregnancy in third trimester 12/26/2015     Current Outpatient Prescriptions on File Prior to Visit  Medication Sig Dispense Refill  . acetaminophen (TYLENOL) 500 MG tablet Take 500 mg by mouth every 6 (six) hours as needed.    . cyclobenzaprine (FLEXERIL) 5 MG tablet Take 1 tablet (5 mg total) by mouth 2 (two) times daily as needed for muscle spasms. (Patient not taking: Reported on 03/20/2017) 20 tablet 1  . levonorgestrel (MIRENA) 20 MCG/24HR IUD 1 each by Intrauterine route once.    . naproxen (NAPROSYN) 500 MG tablet Take 1 tablet (500 mg total) by mouth 2 (two) times daily as needed. (Patient not taking: Reported on 03/20/2017) 30 tablet 0  . Prenatal Vit-Fe Fumarate-FA (PRENATAL MULTIVITAMIN) TABS tablet Take 1 tablet by mouth daily at 12 noon. (Patient not taking: Reported on 02/13/2017) 30 tablet 1  . senna-docusate (SENOKOT-S) 8.6-50 MG tablet Take 2 tablets by mouth 2 (two) times daily as needed for mild constipation or moderate constipation. (Patient not taking: Reported on 02/13/2017) 30 tablet 0   No current facility-administered medications on file prior to visit.     No Known Allergies  Social History   Social History  . Marital status: Media planner    Spouse name: N/A  . Number of  children: N/A  . Years of education: N/A   Occupational History  . Not on file.   Social History Main Topics  . Smoking status: Never Smoker  . Smokeless tobacco: Never Used  . Alcohol use No  . Drug use: No  . Sexual activity: Not on file   Other Topics Concern  . Not on file   Social History Narrative  . No narrative on file    No family history on file.  No past surgical history on file.  ROS: Review of Systems  Constitutional: Negative for fever and unexpected weight change.       Not getting in any exercise. Sleeping okay. Feels she does well with limiting portion sizes.   HENT: Negative for hearing loss and sore throat.   Eyes: Positive for visual disturbance (blurry at times.  About 1-2 x a wk). Negative for redness.  Respiratory: Negative for shortness of breath.   Cardiovascular: Negative for chest pain.  Gastrointestinal: Negative for abdominal pain and blood in stool.  Endocrine: Negative for cold intolerance and heat intolerance.  Genitourinary: Negative for difficulty urinating.  Neurological: Positive for dizziness. Facial asymmetry: occasionally if she gets agitiated.  Psychiatric/Behavioral: Negative for dysphoric mood and sleep disturbance.    PHYSICAL EXAM: BP 112/78   Pulse 77   Temp 98.2 F (36.8 C) (Oral)   Resp 16   Wt 134 lb (60.8 kg)   SpO2 99%  BMI 27.06 kg/m   Wt Readings from Last 3 Encounters:  03/20/17 134 lb (60.8 kg)  02/13/17 133 lb 9.6 oz (60.6 kg)  12/26/15 158 lb (71.7 kg)   Physical Exam Constitutional: Appears well-developed and well-nourished. No distress. Head: Normocephalic. Atraumatic Ears: External right and left ear normal.   Eyes: Conjunctivae and EOM are normal. PERRLA, no scleral icterus.  Mouth: no oral lesions, good oral hygiene, throat clear without exudates Neck: Neck supple.  No tracheal deviation. No thyromegaly. No cervical LN CVS: RRR, S1/S2 +, no murmurs, no gallops, no carotid bruit. No  JVD Pulmonary: Effort and breath sounds normal, no stridor, rhonchi, wheezes, rales.  Breast: no abnormal dimpling.  No abnormal masses felt Abdominal: Soft. BS +,  no distension, tenderness, rebound or guarding. No organomegaly or masses Musculoskeletal: Normal range of motion. No edema and no tenderness.  Neuro: Alert and oriented x3.  Cns grossly intact, Power: 5/5 BL in all 4s.  Reflexes normal.  Normal  muscle tone, coordination. . Skin: Skin is warm and dry. No rash noted. Not diaphoretic. No erythema. No pallor.  Psychiatric: Normal mood and affect. Behavior, judgment, thought content normal.   Results for orders placed or performed in visit on 03/20/17  POCT glycosylated hemoglobin (Hb A1C)  Result Value Ref Range   Hemoglobin A1C 5.4     ASSESSMENT AND PLAN: 1. Well adult exam - Cytology - PAP - CBC - Comprehensive metabolic panel  2. Diabetes mellitus screening -normal A1C - POCT glycosylated hemoglobin (Hb A1C)  3. Blurred vision -advise to have eye exam done. Cheapest places are [facility]  4. Over weight -discussed importance of healthy eating and regular exercise   Patient was given the opportunity to ask questions.  Patient verbalized understanding of the plan and was able to repeat key elements of the plan.  [employer] interpreter used during this encounter.  Orders Placed This Encounter  Procedures  . CBC  . Comprehensive metabolic panel  . POCT glycosylated hemoglobin (Hb A1C)     Requested Prescriptions    No prescriptions requested or ordered in this encounter    Return if symptoms worsen or fail to improve.  Brenda Blueeborah Yuritza Paulhus, MD, FACP

## 2017-03-20 NOTE — Patient Instructions (Signed)
Hacer ejercicio para mantenerse sano  (Exercising to Stay Healthy)  Hacer actividad física con regularidad es muy importante. Tiene muchos otros beneficios, como por ejemplo:  · Mejorar el estado físico, la flexibilidad y la resistencia.  · Aumenta la densidad ósea.  · Ayuda a controlar el peso.  · Disminuye la grasa corporal.  · Aumenta la fuerza muscular.  · Reduce el estrés y las tensiones.  · Mejora el estado de salud general.  Para estar sano y mantenerse así, se recomienda que haga ejercicio de intensidad moderada y de intensidad vigorosa. Puede saber que está haciendo ejercicio de intensidad moderada si tiene una frecuencia cardíaca más elevada y una respiración más rápida, pero aún puede mantener una conversación. Puede saber que está haciendo ejercicio de intensidad vigorosa si respira con mucha más dificultad y rapidez, y no puede mantener una conversación.  ¿CON QUÉ FRECUENCIA DEBO HACER EJERCICIO?  Elija una actividad que disfrute y establezca objetivos realistas. El médico puede ayudarlo a elaborar un plan de actividades que funcione para usted. Haga ejercicio regularmente como se lo haya indicado el médico. Esta puede incluir:  · Realizar entrenamiento de resistencia dos veces por semana, como:  ? Flexiones de brazos.  ? Abdominales.  ? Levantamiento de pesas.  ? Ejercicios con bandas elásticas.  · Realizar una intensidad determinada de ejercicio durante una cantidad determinada de tiempo. Elija entre estas opciones:  ? 150 minutos de ejercicio de intensidad moderada cada semana.  ? 75 minutos de ejercicio de intensidad vigorosa cada semana.  ? Una mezcla de ejercicio de intensidad moderada y vigorosa cada semana.  Los niños, las mujeres embarazadas, las personas que no están en forma, las personas con sobrepeso y los adultos mayores tal vez tengan que consultar a un médico para que les dé recomendaciones individuales. Si tiene alguna enfermedad, asegúrese de consultar al médico antes de comenzar un  programa de ejercicios nuevo.  ¿CUÁLES SON ALGUNAS IDEAS DE EJERCICIO?  Algunas ideas de ejercicio de intensidad moderada incluyen:  · Caminar a un ritmo de 1 milla (1,6 kilómetros) en 15 minutos.  · Andar en bicicleta.  · Hacer senderismo.  · Jugar al golf.  · Bailar.  Algunas ideas de ejercicio de intensidad vigorosa incluyen:  · Caminar a un ritmo de al menos 4,5 millas (7 kilómetros) por hora.  · Trotar o correr a un ritmo de 5 millas (8 kilómetros) por hora.  · Andar en bicicleta a un ritmo de al menos 10 millas (16 kilómetros) por hora.  · Practicar natación.  · Practicar patinaje sobre ruedas normales o en línea.  · Hacer esquí de fondo.  · Hacer deportes competitivos vigorosos, como fútbol americano, básquet y fútbol.  · Saltar la soga.  · Tomar clases de baile aeróbico.  ¿CUÁLES SON ALGUNAS ACTIVIDADES DIARIAS QUE PUEDEN AYUDARME A HACER EJERCICIO?  · Trabajo en el jardín, como:  ? Empujar una cortadora de césped.  ? Juntar y embolsar hojas.  · Lavar y encerar el automóvil.  · Empujar un cochecito.  · Palear nieve.  · Cuidar el jardín.  · Lavar las ventanas o los pisos.  ¿CÓMO PUEDO SER MÁS ACTIVO EN MIS ACTIVIDADES DIARIAS?  · Utilice las escaleras en lugar del ascensor.  · Dé una caminata durante su hora de almuerzo.  · Si conduce, estacione el automóvil más lejos del trabajo o de la escuela.  · Si usa transporte público, bájese una parada antes y camine el resto del camino.  · Póngase de pie y camine   cada vez que haga llamadas telefónicas.  · Levántese, estírese y camine cada 30 minutos a lo largo del día.  ¿QUÉ PAUTAS DEBO SEGUIR MIENTRAS HAGO EJERCICIO?  · No haga ejercicio en exceso que pudiera hacer que se lastime, se sienta mareado o tenga dificultad para respirar.  · Consulte al médico antes de comenzar un programa de ejercicios nuevo.  · Use ropa cómoda y calzado con buen soporte.  · Beba gran cantidad de agua mientras hace ejercicios para evitar la deshidratación o los golpes de calor. Durante la  actividad física se pierde agua corporal que se debe reponer.  · Haga ejercicio hasta que se acelere su respiración y sus latidos cardíacos.  Esta información no tiene como fin reemplazar el consejo del médico. Asegúrese de hacerle al médico cualquier pregunta que tenga.  Document Released: 12/23/2010 Document Revised: 10/09/2014 Document Reviewed: 02/19/2014  Elsevier Interactive Patient Education © 2018 [employer]

## 2017-03-21 ENCOUNTER — Telehealth: Payer: Self-pay

## 2017-03-21 LAB — CBC
HEMATOCRIT: 39.9 % (ref 34.0–46.6)
Hemoglobin: 12.8 g/dL (ref 11.1–15.9)
MCH: 27.2 pg (ref 26.6–33.0)
MCHC: 32.1 g/dL (ref 31.5–35.7)
MCV: 85 fL (ref 79–97)
PLATELETS: 316 10*3/uL (ref 150–379)
RBC: 4.7 x10E6/uL (ref 3.77–5.28)
RDW: 13.7 % (ref 12.3–15.4)
WBC: 8 10*3/uL (ref 3.4–10.8)

## 2017-03-21 LAB — COMPREHENSIVE METABOLIC PANEL
ALK PHOS: 57 IU/L (ref 39–117)
ALT: 18 IU/L (ref 0–32)
AST: 18 IU/L (ref 0–40)
Albumin/Globulin Ratio: 1.5 (ref 1.2–2.2)
Albumin: 4.6 g/dL (ref 3.5–5.5)
BUN/Creatinine Ratio: 22 (ref 9–23)
BUN: 12 mg/dL (ref 6–20)
Bilirubin Total: 1.3 mg/dL — ABNORMAL HIGH (ref 0.0–1.2)
CO2: 24 mmol/L (ref 20–29)
CREATININE: 0.54 mg/dL — AB (ref 0.57–1.00)
Calcium: 9.5 mg/dL (ref 8.7–10.2)
Chloride: 103 mmol/L (ref 96–106)
GFR calc Af Amer: 142 mL/min/{1.73_m2} (ref >59)
GFR calc non Af Amer: 124 mL/min/{1.73_m2} (ref >59)
GLOBULIN, TOTAL: 3.1 g/dL (ref 1.5–4.5)
GLUCOSE: 72 mg/dL (ref 65–99)
POTASSIUM: 4.2 mmol/L (ref 3.5–5.2)
SODIUM: 140 mmol/L (ref 134–144)
Total Protein: 7.7 g/dL (ref 6.0–8.5)

## 2017-03-21 LAB — CERVICOVAGINAL ANCILLARY ONLY
CHLAMYDIA, DNA PROBE: NEGATIVE
Candida vaginitis: NEGATIVE
NEISSERIA GONORRHEA: NEGATIVE
Trichomonas: NEGATIVE

## 2017-03-21 LAB — CYTOLOGY - PAP
DIAGNOSIS: NEGATIVE
HPV (WINDOPATH): NOT DETECTED

## 2017-03-21 NOTE — Telephone Encounter (Signed)
[employer] Runner[employer] Id# [id number] contacted pt to go over lab results pt is aware of results and doesn't have any questions or concers

## 2017-06-13 ENCOUNTER — Ambulatory Visit: Payer: Self-pay | Admitting: *Deleted

## 2018-01-12 ENCOUNTER — Encounter (HOSPITAL_COMMUNITY): Payer: Self-pay

## 2018-01-12 ENCOUNTER — Emergency Department (HOSPITAL_COMMUNITY)
Admission: EM | Admit: 2018-01-12 | Discharge: 2018-01-12 | Disposition: A | Discharge status: Home / Self Care | Payer: Self-pay | Attending: Emergency Medicine | Admitting: Emergency Medicine

## 2018-01-12 ENCOUNTER — Emergency Department (HOSPITAL_COMMUNITY): Payer: Self-pay

## 2018-01-12 ENCOUNTER — Other Ambulatory Visit: Payer: Self-pay

## 2018-01-12 DIAGNOSIS — M546 Pain in thoracic spine: Secondary | ICD-10-CM

## 2018-01-12 DIAGNOSIS — Z79899 Other long term (current) drug therapy: Secondary | ICD-10-CM | POA: Insufficient documentation

## 2018-01-12 MED ORDER — LIDOCAINE 5 % EX PTCH
1.0000 | MEDICATED_PATCH | CUTANEOUS | Status: DC
Start: 2018-01-12 — End: 2018-01-12

## 2018-01-12 MED ORDER — CYCLOBENZAPRINE HCL 10 MG PO TABS
10.0000 mg | ORAL_TABLET | Freq: Once | ORAL | Status: AC
  Administered 2018-01-12: 10 mg via ORAL
  Filled 2018-01-12: qty 1

## 2018-01-12 MED ORDER — LIDOCAINE 5 % EX PTCH
1.0000 | MEDICATED_PATCH | CUTANEOUS | Status: DC
  Administered 2018-01-12: 1 via TRANSDERMAL
  Filled 2018-01-12: qty 1

## 2018-01-12 MED ORDER — CYCLOBENZAPRINE HCL 10 MG PO TABS: 10.0000 mg | ORAL_TABLET | Freq: Two times a day (BID) | ORAL | 0 refills | Status: DC | PRN

## 2018-01-12 NOTE — ED Notes (Addendum)
Pt verbalized understanding of d/c instructions and has no further questions, instructions given using interpreter ipad. VSS, NAD.

## 2018-01-12 NOTE — ED Provider Notes (Signed)
[facility] EMERGENCY DEPARTMENT Provider Note   CSN: [id number] Arrival date & time: 01/12/18  1257     History   Chief Complaint No chief complaint on file.   HPI Brenda Dudley is a 37 y.o. female.  The history is provided by the patient.  Back Pain   This is a chronic problem. The current episode started more than 1 week ago. The problem occurs every several days. The problem has not changed since onset.The pain is associated with no known injury. The pain is present in the thoracic spine. The quality of the pain is described as aching. The pain does not radiate. The pain is at a severity of 3/10. The pain is mild. The symptoms are aggravated by bending, twisting and certain positions. The pain is the same all the time. Pertinent negatives include no chest pain, no fever, no numbness, no weight loss, no abdominal pain, no abdominal swelling, no bowel incontinence, no dysuria and no tingling. She has tried NSAIDs for the symptoms. The treatment provided mild relief.    History reviewed. No pertinent past medical history.  Patient Active Problem List   Diagnosis Date Noted  . Diabetes mellitus screening 03/20/2017  . Normal pregnancy in third trimester 12/26/2015    History reviewed. No pertinent surgical history.   OB History    Gravida  3   Para  3   Term  3   Preterm      AB      Living  3     SAB      TAB      Ectopic      Multiple  0   Live Births  3            Home Medications    Prior to Admission medications   Medication Sig Start Date End Date Taking? Authorizing Provider  acetaminophen (TYLENOL) 500 MG tablet Take 500 mg by mouth every 6 (six) hours as needed.    [provider]  cyclobenzaprine (FLEXERIL) 10 MG tablet Take 1 tablet (10 mg total) by mouth 2 (two) times daily as needed for muscle spasms. 01/12/18   Virgina Norfolk, DO  levonorgestrel (MIRENA) 20 MCG/24HR IUD 1 each by Intrauterine route  once.    [provider]  naproxen (NAPROSYN) 500 MG tablet Take 1 tablet (500 mg total) by mouth 2 (two) times daily as needed. Patient not taking: Reported on 03/20/2017 02/13/17   Marcine Matar, MD  Prenatal Vit-Fe Fumarate-FA (PRENATAL MULTIVITAMIN) TABS tablet Take 1 tablet by mouth daily at 12 noon. Patient not taking: Reported on 02/13/2017 12/28/15   Pincus Large, DO  senna-docusate (SENOKOT-S) 8.6-50 MG tablet Take 2 tablets by mouth 2 (two) times daily as needed for mild constipation or moderate constipation. Patient not taking: Reported on 02/13/2017 12/28/15   Pincus Large, DO    Family History History reviewed. No pertinent family history.  Social History Social History   Tobacco Use  . Smoking status: Never Smoker  . Smokeless tobacco: Never Used  Substance Use Topics  . Alcohol use: No  . Drug use: No     Allergies   Patient has no known allergies.   Review of Systems Review of Systems  Constitutional: Negative for chills, fever and weight loss.  HENT: Negative for ear pain and sore throat.   Eyes: Negative for pain and visual disturbance.  Respiratory: Negative for cough and shortness of breath.   Cardiovascular: Negative  for chest pain and palpitations.  Gastrointestinal: Negative for abdominal pain, bowel incontinence and vomiting.  Genitourinary: Negative for dysuria and hematuria.  Musculoskeletal: Positive for back pain. Negative for arthralgias.  Skin: Negative for color change and rash.  Neurological: Negative for tingling, seizures, syncope and numbness.  All other systems reviewed and are negative.    Physical Exam Updated Vital Signs There were no vitals taken for this visit.  Physical Exam  Constitutional: She appears well-developed and well-nourished. No distress.  HENT:  Head: Normocephalic and atraumatic.  Eyes: Pupils are equal, round, and reactive to light. Conjunctivae and EOM are normal.  Neck: Normal range of  motion. Neck supple.  Cardiovascular: Normal rate and regular rhythm.  No murmur heard. Pulmonary/Chest: Effort normal and breath sounds normal. No respiratory distress.  Abdominal: Soft. There is no tenderness.  Musculoskeletal: She exhibits tenderness (TTP is paraspinal thoracic muscles, no midline spinal tenderness). She exhibits no edema.  Neurological: She is alert.  Skin: Skin is warm and dry.  Psychiatric: She has a normal mood and affect.  Nursing note and vitals reviewed.    ED Treatments / Results  Labs (all labs ordered are listed, but only abnormal results are displayed) Labs Reviewed - No data to display  EKG None  Radiology Dg Thoracic Spine 2 View  Result Date: 01/12/2018 CLINICAL DATA:  37 year old female with a history of upper back pain for 2 weeks. No known injury. EXAM: THORACIC SPINE 2 VIEWS COMPARISON:  None. FINDINGS: Thoracic Spine: Thoracic vertebral elements maintain normal anatomic alignment, with no evidence of anterolisthesis, retrolisthesis, or subluxation. No acute fracture line identified. Vertebral body heights maintained. No significant endplate changes or facet disease. Unremarkable appearance of the visualized thorax. IMPRESSION: Negative for acute fracture malalignment of the thoracic spine. Electronically Signed   By: Gilmer Mor D.O.   On: 01/12/2018 14:18    Procedures Procedures (including critical care time)  Medications Ordered in ED Medications  lidocaine (LIDODERM) 5 % 1 patch (has no administration in time range)  cyclobenzaprine (FLEXERIL) tablet 10 mg (has no administration in time range)  lidocaine (LIDODERM) 5 % 1 patch (has no administration in time range)     Initial Impression / Assessment and Plan / ED Course  I have reviewed the triage vital signs and the nursing notes.  Pertinent labs & imaging results that were available during my care of the patient were reviewed by me and considered in my medical decision making  (see chart for details).     Brenda Dudley is a 37 year old female with no significant medical history who presents to the ED with upper back pain.  Patient with normal vitals.  No fever.  Patient with pain on and off for the last several months.  Has been particularly worse the last 2 weeks.  Patient has used Tylenol Motrin with some relief.  Patient denies any cough, sputum production.  No PE or DVT risk factors.  Patient denies any shortness of breath, recent surgery, recent travel.  Patient is tender in the paraspinal muscles in the thoracic area consistent with muscle spasm.  Patient otherwise unremarkable exam including clear breath sounds.  No signs of volume overload on exam.  Patient given Flexeril and lidocaine patch while in the ED. Suspect muscles spasms, MSK source. Given information to follow-up with primary care provider and discharged from the ED in good condition.  Given return precautions.  Final Clinical Impressions(s) / ED Diagnoses   Final diagnoses:  Acute thoracic back  pain, unspecified back pain laterality    ED Discharge Orders        Ordered    cyclobenzaprine (FLEXERIL) 10 MG tablet  2 times daily PRN     01/12/18 1539       Virgina NorfolkCuratolo, Awanda Wilcock, DO 01/12/18 1540    Blane OharaZavitz, Joshua, MD 01/13/18 [phone]

## 2018-01-12 NOTE — ED Triage Notes (Addendum)
Pt presents with 2 week h/o upper back pain.  Pt reports taking OTC medication w/o relief.  Pt denies any injury.  Pt now reports pain for several months, has been seen at a clinic and prescribed medication which did not help.

## 2018-08-24 ENCOUNTER — Encounter (HOSPITAL_COMMUNITY): Payer: Self-pay

## 2018-08-24 ENCOUNTER — Emergency Department (HOSPITAL_COMMUNITY)
Admission: EM | Admit: 2018-08-24 | Discharge: 2018-08-24 | Disposition: A | Discharge status: Home / Self Care | Payer: Self-pay | Attending: Emergency Medicine | Admitting: Emergency Medicine

## 2018-08-24 DIAGNOSIS — M25561 Pain in right knee: Secondary | ICD-10-CM

## 2018-08-24 DIAGNOSIS — M222X1 Patellofemoral disorders, right knee: Secondary | ICD-10-CM | POA: Insufficient documentation

## 2018-08-24 MED ORDER — KNEE SLEEVE/MEDIUM MISC: 1.0000 | Freq: Every day | 0 refills | Status: DC

## 2018-08-24 MED ORDER — NAPROXEN 500 MG PO TBEC: 500.0000 mg | DELAYED_RELEASE_TABLET | Freq: Two times a day (BID) | ORAL | 1 refills | Status: AC

## 2018-08-24 NOTE — ED Provider Notes (Addendum)
[facility]   CSN: [id number] Arrival date & time: 08/24/18  1234     History   Chief Complaint No chief complaint on file.   HPI Brenda Dudley is a 37 y.o. female.  37 year old female with no significant past medical history, presented to emergency department due to 3 weeks history of new onset throbbing pain on her right knee.  The pain is worse when she is trying to stand up from sitting or bending over position.  She reports mild pain with walking and major pain when she is trying to go up or down the stairs.  She denies any trauma to her knee. She did not notice any swelling, redness on the knee.  No other joint pain. No fever and no rash. The first time she experienced knee pain and she did not see any doctor for this complaint. She does not work outside the home currently and take care of her children at home. 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  Her pain got worse last few days and last night she was not able to sleep well despite taking 2 ibuprofen for her pain.       No past medical history on file.  Patient Active Problem List   Diagnosis Date Noted  . Diabetes mellitus screening 03/20/2017  . Normal pregnancy in third trimester 12/26/2015    No past surgical history on file.   OB History    Gravida  3   Para  3   Term  3   Preterm      AB      Living  3     SAB      TAB      Ectopic      Multiple  0   Live Births  3            Home Medications    Prior to Admission medications   Medication Sig Start Date End Date Taking? Authorizing Provider  acetaminophen (TYLENOL) 500 MG tablet Take 500 mg by mouth every 6 (six) hours as needed.    [provider]  cyclobenzaprine (FLEXERIL) 10 MG tablet Take 1 tablet (10 mg total) by mouth 2 (two) times daily as needed for muscle spasms. 01/12/18   Virgina Norfolkuratolo, Adam, DO  levonorgestrel (MIRENA) 20  MCG/24HR IUD 1 each by Intrauterine route once.    [provider]  naproxen (NAPROSYN) 500 MG tablet Take 1 tablet (500 mg total) by mouth 2 (two) times daily as needed. Patient not taking: Reported on 03/20/2017 02/13/17   Marcine MatarJohnson, Deborah B, MD  Prenatal Vit-Fe Fumarate-FA (PRENATAL MULTIVITAMIN) TABS tablet Take 1 tablet by mouth daily at 12 noon. Patient not taking: Reported on 02/13/2017 12/28/15   Pincus LargePhelps, Jazma Y, DO  senna-docusate (SENOKOT-S) 8.6-50 MG tablet Take 2 tablets by mouth 2 (two) times daily as needed for mild constipation or moderate constipation. Patient not taking: Reported on 02/13/2017 12/28/15   Pincus LargePhelps, Jazma Y, DO    Family History No family history on file.  Social History Social History   Tobacco Use  . Smoking status: Never Smoker  . Smokeless tobacco: Never Used  Substance Use Topics  . Alcohol use: No  . Drug use: No     Allergies   Patient has no known allergies.   Review of Systems Review of Systems  Constitutional: Negative for chills and fever.  Genitourinary: Positive for vaginal discharge.  Musculoskeletal: Positive for arthralgias. Negative for joint swelling.  Skin: Negative for rash.     Physical Exam Updated Vital Signs There were no vitals taken for this visit.  Physical Exam  Constitutional: She is oriented to person, place, and time. She appears well-developed and well-nourished.  HENT:  Head: Normocephalic and atraumatic.  Eyes: EOM are normal.  Cardiovascular: Normal rate, regular rhythm and normal heart sounds.  No murmur heard. Pulmonary/Chest: Effort normal and breath sounds normal. No respiratory distress. She has no wheezes. She has no rales.  Abdominal: Soft. Bowel sounds are normal. She exhibits no distension. There is no tenderness.  Musculoskeletal: She exhibits no edema or tenderness.       Legs: Neurological: She is alert and oriented to person, place, and time. No sensory deficit. She exhibits normal  muscle tone.  Skin: Skin is warm and dry. No rash noted.  Psychiatric: She has a normal mood and affect. Her behavior is normal.     ED Treatments / Results  Labs (all labs ordered are listed, but only abnormal results are displayed) Labs Reviewed - No data to display  EKG None  Radiology No results found.  Procedures Procedures (including critical care time)  Medications Ordered in ED Medications - No data to display   Initial Impression / Assessment and Plan / ED Course  I have reviewed the triage vital signs and the nursing notes.  Pertinent labs & imaging results that were available during my care of the patient were reviewed by me and considered in my medical decision making (see chart for details).    37 year old lady, otherwise healthy presented with sudden and new onset right knee pain for the last 3 weeks.  The pain mostly happened when she was standing from as sitting or bending over position when she is going up or down the stairs.  No swelling, effusion, redness on exam.  Range of motion is complete but mildly painful. Her symptoms, pain with squatting, likely secondary to patellofemoral syndrome. Will prescribe naproxen 500 mg twice daily for 7 days and knee sleeve and recommend doing Quadriceps muscle strengthening exercise.   Final Clinical Impressions(s) / ED Diagnoses   Final diagnoses:  None    ED Discharge Orders    None       Chevis Pretty, MD 08/24/18 1324    Chevis Pretty, MD 08/24/18 2039    Alvira Monday, MD 09/01/18 1326

## 2018-08-24 NOTE — ED Notes (Signed)
Patient given discharge instructions and verbalized understanding.  Patient stable to discharge at this time.  Patient is alert and oriented to baseline.  No distressed noted at this time.  All belongings taken with the patient at discharge.   

## 2018-08-24 NOTE — ED Triage Notes (Signed)
Pt c/o right knee pain for several weeks. Pt denies injury to the area, states she has not been able to do anything but drive.

## 2020-07-15 ENCOUNTER — Other Ambulatory Visit: Payer: Self-pay

## 2020-07-15 ENCOUNTER — Ambulatory Visit: Payer: Self-pay | Attending: Internal Medicine | Admitting: Internal Medicine

## 2020-07-15 ENCOUNTER — Ambulatory Visit: Payer: Self-pay | Attending: Internal Medicine | Admitting: Pharmacist

## 2020-07-15 ENCOUNTER — Encounter: Payer: Self-pay | Admitting: Internal Medicine

## 2020-07-15 VITALS — BP 106/69 | HR 72 | Resp 16 | Ht 59.5 in | Wt 134.6 lb

## 2020-07-15 DIAGNOSIS — K5904 Chronic idiopathic constipation: Secondary | ICD-10-CM

## 2020-07-15 DIAGNOSIS — Z23 Encounter for immunization: Secondary | ICD-10-CM

## 2020-07-15 DIAGNOSIS — Z124 Encounter for screening for malignant neoplasm of cervix: Secondary | ICD-10-CM

## 2020-07-15 DIAGNOSIS — Z0000 Encounter for general adult medical examination without abnormal findings: Secondary | ICD-10-CM

## 2020-07-15 DIAGNOSIS — Z1159 Encounter for screening for other viral diseases: Secondary | ICD-10-CM

## 2020-07-15 DIAGNOSIS — E663 Overweight: Secondary | ICD-10-CM

## 2020-07-15 NOTE — Patient Instructions (Addendum)
Alimentacin saludable Healthy Eating Seguir una modalidad de alimentacin saludable puede ayudarlo a alcanzar y mantener un peso saludable, reducir el riesgo de tener enfermedades crnicas y vivir una vida larga y productiva. Es importante que siga una modalidad de alimentacin saludable con un nivel adecuado de caloras para su cuerpo. Debe cubrir sus necesidades nutricionales principalmente a travs de los alimentos, escogiendo una variedad de alimentos ricos en nutrientes. Cules son algunos consejos para seguir este plan? Lea las etiquetas de los alimentos  Lea las etiquetas y elija las que digan lo siguiente: ? Reducido en sodio o con bajo contenido de sodio. ? Jugos con 100% jugo de fruta. ? Alimentos con bajo contenido de grasas saturadas y alto contenido de grasas poliinsaturadas y monoinsaturadas. ? Alimentos con cereales integrales, como trigo integral, trigo partido, arroz integral y arroz salvaje. ? Cereales integrales fortificados con cido flico. Se recomienda a las mujeres embarazadas o que desean quedar embarazadas.  Lea las etiquetas y evite: ? Los alimentos con una gran cantidad de azcares agregados. Estos incluyen los alimentos que contienen azcar moreno, endulzante a base de maz, jarabe de maz, dextrosa, fructosa, glucosa, jarabe de maz de alta fructosa, miel, azcar invertido, lactosa, jarabe de malta, maltosa, melaza, azcar sin refinar, sacarosa, trehalosa y azcar turbinado.  No consuma ms que las siguientes cantidades de azcar agregada por da:  6 cucharaditas (25 g) las mujeres.  9 cucharaditas (38 g) los hombres. ? Los alimentos que contienen almidones y cereales refinados o procesados. ? Los productos de cereales refinados, como harina blanca, harina de maz desgerminada, pan blanco y arroz blanco. Al ir de compras  Elija refrigerios ricos en nutrientes, como verduras, frutas enteras y frutos secos. Evite los refrigerios con alto contenido de caloras y  azcar, como las papas fritas, los refrigerios frutales y los caramelos.  Use alios y productos para untar a base de aceite con los alimentos en lugar de grasas slidas como la mantequilla, la margarina en barra o el queso crema.  Limite las salsas, las mezclas y los productos "instantneos" preelaborados como el arroz saborizado, los fideos instantneos y las pastas listas para comer.  Pruebe ms fuentes de protena vegetal, como tofu, tempeh, frijoles negros, edamame, lentejas, frutos secos y semillas.  Explore planes de alimentacin como la dieta mediterrnea o la dieta vegetariana. Al cocinar  Use aceite para saltear los alimentos en lugar de grasas slidas como mantequilla, margarina en barra o manteca de cerdo.  En lugar de frer, trate de cocinar en el horno, en la plancha o en la parrilla, o hervir los alimentos.  Retire la parte grasa de las carnes antes de cocinarlas.  Cocine las verduras al vapor en agua o caldo. Planificacin de las comidas   En las comidas, imagine dividir su plato en cuartos: ? La mitad del plato tiene frutas y verduras. ? Un cuarto del plato tiene cereales integrales. ? Un cuarto del plato tiene protena, especialmente carnes magras, aves, huevos, tofu, frijoles o frutos secos.  Incluya lcteos descremados en su dieta diaria. Estilo de vida  Elija opciones saludables en todos los mbitos, como en el hogar, el trabajo, la escuela, los restaurantes y las tiendas.  Prepare los alimentos de un modo seguro: ? Lvese las manos despus de manipular carnes crudas. ? Mantenga las superficies de preparacin de los alimentos limpias lavndolas regularmente con agua caliente y jabn. ? Mantenga las carnes crudas separadas de los alimentos que estn listos para comer como las frutas y las verduras. ?   Cocine los frutos de mar, carnes, aves y huevos hasta alcanzar la temperatura interna recomendada. ? Almacene los alimentos a temperaturas seguras. En  general:  Mantenga los alimentos fros a una temperatura de 40F (4,4C) o inferior.  Mantenga los alimentos calientes a una temperatura de 140F (60C) o superior.  Mantenga el congelador a una temperatura de 0 F (-17,8C) o inferior.  Los alimentos dejan de ser seguros para su consumo cuando han estado a una temperatura de entre 40 y 140F (4,4 y 60C) por ms de 2horas. Qu alimentos debo consumir? Frutas Propngase comer el equivalente a 2tazas de frutas frescas, enlatadas (en su jugo natural) o congeladas cada da. Algunos ejemplos de equivalentes a 1taza de frutas son 1manzana pequea, 8fresas grandes, 1taza de fruta enlatada, taza de fruta desecada o 1 taza de jugo 100%. Verduras Propngase comer el equivalente a 2 o 3tazas de verduras frescas y congeladas cada da, incluyendo diferentes variedades y colores. Algunos ejemplos de equivalentes a 1taza de verduras son 2zanahorias medianas, 2 tazas de verduras de hoja verde crudas, 1taza de verduras cortadas (crudas o cocidas) o 1papa mediana al horno. Granos Propngase comer el equivalente a 6onzas de cereales integrales por da. Algunos ejemplos de equivalentes a 1onza de cereales son 1rebanada de pan, 1taza de cereal listo para comer, 3tazas de palomitas de maz o  taza de arroz, pasta o cereales cocidos. Carnes y otras protenas Propngase comer el equivalente a 5 o 6onzas de protena por da. Algunos ejemplos de equivalentes a 1 onza de protena son 1huevo, taza de frutos secos o semillas o 1 cucharada (16g) de mantequilla de man. Un corte de carne o pescado del tamao de un mazo de cartas equivale aproximadamente a 3 a 4 onzas.  De las protenas que consume cada semana, intente que al menos 8onzas provengan de frutos de mar. Esto incluye el salmn, la trucha, el arenque y las anchoas. Lcteos Propngase comer el equivalente a 3tazas de lcteos descremados o con bajo contenido de grasa cada da.  Algunos ejemplos de equivalentes a 1taza de lcteos son 1taza (240ml) de leche, 8onzas (250g) de yogur, 1onzas (44g) de queso natural o 1 taza (240ml) de leche de soja fortificada. Grasas y aceites  Propngase consumir alrededor de 5 cucharaditas (21g) por da. Elija grasas monoinsaturadas, como el aceite de canola y de oliva, aguacate, mantequilla de man y la mayora de los frutos secos, o bien grasas poliinsaturadas, como el aceite de girasol, maz y soja, nueces, piones, semillas de ssamo, semillas de girasol y semillas de lino. Bebidas  Propngase beber seis vasos de 8 onzas de agua por da. Limite el caf a entre tres y cinco tazas de 8 onzas por da.  Limite el consumo de bebidas con cafena que tengan caloras agregadas, como los refrescos y las bebidas energizantes.  Limite el consumo de alcohol a no ms de 1medida por da si es mujer y no est embarazada, y a 2medidas por da si es hombre. Una medida equivale a 12onzas de cerveza (355ml), 5onzas de vino (148ml) o 1onzas de bebidas alcohlicas de alta graduacin (44ml). Condimentos y otros alimentos  Eviteagregar cantidades excesivas de sal a los alimentos. Pruebe darles sabor con hierbas y especias en lugar de sal.  Evite agregar azcar a los alimentos.  Pruebe usar alios, salsas y productos untables a base de aceite en lugar de grasas slidas. Esta informacin se basa en las pautas generales de nutricin de los [place] Para obtener ms informacin, visite choosemyplate.gov. Las   cantidades exactas pueden variar en funcin de sus necesidades nutricionales. Resumen  Un plan de alimentacin saludable puede ayudarlo a mantener un peso saludable, reducir el riesgo de tener enfermedades crnicas y mantenerse activo durante toda su vida.  Planifique sus comidas. Asegrese de consumir las porciones correctas de una variedad de alimentos ricos en nutrientes.  En lugar de frer, trate de cocinar en el horno, en la  plancha o en la parrilla, o hervir los alimentos.  Elija opciones saludables en todos los mbitos, como en el hogar, el trabajo, la escuela, los restaurantes y las tiendas. Esta informacin no tiene como fin reemplazar el consejo del mdico. Asegrese de hacerle al mdico cualquier pregunta que tenga. Document Revised: 02/11/2018 Document Reviewed: 02/11/2018 Elsevier Patient Education  2020 Elsevier Inc.   Influenza Virus Vaccine injection (Fluarix) Qu es este medicamento? La VACUNA ANTIGRIPAL ayuda a disminuir el riesgo de contraer la influenza, tambin conocida como la gripe. La vacuna solo ayuda a protegerle contra algunas cepas de influenza. Esta vacuna no ayuda a reducir el riesgo de contraer influenza pandmica H1N1. Este medicamento puede ser utilizado para otros usos; si tiene alguna pregunta consulte con su proveedor de atencin mdica o con su farmacutico. MARCAS COMUNES: Fluarix, Fluzone Qu le debo informar a mi profesional de la salud antes de tomar este medicamento? Necesita saber si usted presenta alguno de los siguientes problemas o situaciones:  trastorno de sangrado como hemofilia  fiebre o infeccin  sndrome de Guillain-Barre u otros problemas neurolgicos  problemas del sistema inmunolgico  infeccin por el virus de la inmunodeficiencia humana (VIH) o SIDA  niveles bajos de plaquetas en la sangre  esclerosis mltiple  una reaccin alrgica o inusual a las vacunas antigripales, a los huevos, protenas de pollo, al ltex, a la gentamicina, a otros medicamentos, alimentos, colorantes o conservantes  si est embarazada o buscando quedar embarazada  si est amamantando a un beb Cmo debo utilizar este medicamento? Esta vacuna se administra mediante inyeccin por va intramuscular. Lo administra un profesional de la salud. Recibir una copia de informacin escrita sobre la vacuna antes de cada vacuna. Asegrese de leer este folleto cada vez cuidadosamente.  Este folleto puede cambiar con frecuencia. Hable con su pediatra para informarse acerca del uso de este medicamento en nios. Puede requerir atencin especial. Sobredosis: Pngase en contacto inmediatamente con un centro toxicolgico o una sala de urgencia si usted cree que haya tomado demasiado medicamento. ATENCIN: Este medicamento es solo para usted. No comparta este medicamento con nadie. Qu sucede si me olvido de una dosis? No se aplica en este caso. Qu puede interactuar con este medicamento?  quimioterapia o radioterapia  medicamentos que suprimen el sistema inmunolgico, tales como etanercept, anakinra, infliximab y adalimumab  medicamentos que tratan o previenen cogulos sanguneos, como warfarina  fenitona  medicamentos esteroideos, como la prednisona o la cortisona  teofilina  vacunas Puede ser que esta lista no menciona todas las posibles interacciones. Informe a su profesional de la salud de todos los productos a base de hierbas, medicamentos de venta libre o suplementos nutritivos que est tomando. Si usted fuma, consume bebidas alcohlicas o si utiliza drogas ilegales, indqueselo tambin a su profesional de la salud. Algunas sustancias pueden interactuar con su medicamento. A qu debo estar atento al usar este medicamento? Informe a su mdico o a su profesional de la salud sobre todos los efectos secundarios que persistan despus de 3 das. Llame a su proveedor de atencin mdica si se presentan sntomas inusuales dentro de   las 6 semanas posteriores a la vacunacin. Es posible que todava pueda contraer la gripe, pero la enfermedad no ser tan fuerte como normalmente. No puede contraer la gripe de esta vacuna. La vacuna antigripal no le protege contra resfros u otras enfermedades que pueden causar fiebre. Debe vacunarse cada ao. Qu efectos secundarios puedo tener al utilizar este medicamento? Efectos secundarios que debe informar a su mdico o a su profesional de  la salud tan pronto como sea posible:  reacciones alrgicas como erupcin cutnea, picazn o urticarias, hinchazn de la cara, labios o lengua Efectos secundarios que, por lo general, no requieren atencin mdica (debe informarlos a su mdico o a su profesional de la salud si persisten o si son molestos):  fiebre  dolor de cabeza  molestias y dolores musculares  dolor, sensibilidad, enrojecimiento o hinchazn en el lugar de la inyeccin  cansancio o debilidad Puede ser que esta lista no menciona todos los posibles efectos secundarios. Comunquese a su mdico por asesoramiento mdico sobre los efectos secundarios. Usted puede informar los efectos secundarios a la FDA por telfono al 1-800-FDA-1088. Dnde debo guardar mi medicina? Esta vacuna se administra solamente en clnicas, farmacias, consultorio mdico u otro consultorio de un profesional de la salud y no necesitar guardarlo en su domicilio. ATENCIN: Este folleto es un resumen. Puede ser que no cubra toda la posible informacin. Si usted tiene preguntas acerca de esta medicina, consulte con su mdico, su farmacutico o su profesional de la salud.  2020 Elsevier/Gold Standard (2010-03-22 15:31:40)  

## 2020-07-15 NOTE — Progress Notes (Signed)
Patient presents for vaccination against influenza per orders of Dr. Johnson. Consent given. Counseling provided. No contraindications exists. Vaccine administered without incident.  ° °Luke Van Ausdall, PharmD, CPP °Clinical Pharmacist °[facility] °[phone] ° °

## 2020-07-15 NOTE — Progress Notes (Signed)
Patient ID: Brenda Dudley, female    DOB: 05-Dec-1980  MRN: [id number]  CC: Annual Exam and Gynecologic Exam   Subjective: Brenda Dudley is a 39 y.o. female who presents for chronic ds management Her concerns today include:   Pt is G3P3 Endorses white vaginal dischg with itching She is sexually active with one female partner She has the Mirena IUD x 4 yrs.  Placed at [facility].   No menses with the Mirena No fhx of uterine, breast, ovarian CA  Past medical, surgical, family and social history reviewed and updated. Patient Active Problem List   Diagnosis Date Noted  . Diabetes mellitus screening 03/20/2017  . Normal pregnancy in third trimester 12/26/2015     Current Outpatient Medications on File Prior to Visit  Medication Sig Dispense Refill  . acetaminophen (TYLENOL) 500 MG tablet Take 500 mg by mouth every 6 (six) hours as needed.    . cyclobenzaprine (FLEXERIL) 10 MG tablet Take 1 tablet (10 mg total) by mouth 2 (two) times daily as needed for muscle spasms. (Patient not taking: Reported on 07/15/2020) 20 tablet 0  . levonorgestrel (MIRENA) 20 MCG/24HR IUD 1 each by Intrauterine route once.    . Prenatal Vit-Fe Fumarate-FA (PRENATAL MULTIVITAMIN) TABS tablet Take 1 tablet by mouth daily at 12 noon. (Patient not taking: Reported on 02/13/2017) 30 tablet 1  . senna-docusate (SENOKOT-S) 8.6-50 MG tablet Take 2 tablets by mouth 2 (two) times daily as needed for mild constipation or moderate constipation. (Patient not taking: Reported on 02/13/2017) 30 tablet 0   No current facility-administered medications on file prior to visit.    No Known Allergies  Social History   Socioeconomic History  . Marital status: Media planner    Spouse name: Not on file  . Number of children: 3  . Years of education: Not on file  . Highest education level: 7th grade  Occupational History  . Not on file  Tobacco Use  . Smoking status: Never Smoker  . Smokeless tobacco: Never  Used  Vaping Use  . Vaping Use: Never used  Substance and Sexual Activity  . Alcohol use: No  . Drug use: No  . Sexual activity: Not on file  Other Topics Concern  . Not on file  Social History Narrative  . Not on file   Social Determinants of Health   Financial Resource Strain:   . Difficulty of Paying Living Expenses: Not on file  Food Insecurity:   . Worried About Programme researcher, broadcasting/film/video in the Last Year: Not on file  . Ran Out of Food in the Last Year: Not on file  Transportation Needs:   . Lack of Transportation (Medical): Not on file  . Lack of Transportation (Non-Medical): Not on file  Physical Activity:   . Days of Exercise per Week: Not on file  . Minutes of Exercise per Session: Not on file  Stress:   . Feeling of Stress : Not on file  Social Connections:   . Frequency of Communication with Friends and Family: Not on file  . Frequency of Social Gatherings with Friends and Family: Not on file  . Attends Religious Services: Not on file  . Active Member of Clubs or Organizations: Not on file  . Attends Banker Meetings: Not on file  . Marital Status: Not on file  Intimate Partner Violence:   . Fear of Current or Ex-Partner: Not on file  . Emotionally Abused: Not on file  .  Physically Abused: Not on file  . Sexually Abused: Not on file    No family history on file.  Past Surgical History:  Procedure Laterality Date  . NO PAST SURGERIES      ROS: Review of Systems  Constitutional:       Exercises 3 days a wk - cardio and wghs.  She also runs. She tries to eat healthy.  Not sure why she is not losing wgh.  HENT: Negative for congestion, dental problem, hearing loss and sneezing.   Eyes: Negative for visual disturbance.  Respiratory: Negative for cough and shortness of breath.   Cardiovascular: Negative for chest pain and leg swelling.  Gastrointestinal: Positive for constipation.  Genitourinary: Negative for difficulty urinating.  Neurological:  Negative for dizziness.     PHYSICAL EXAM: BP 106/69   Pulse 72   Resp 16   Ht 4' 11.5" (1.511 m)   Wt 134 lb 9.6 oz (61.1 kg)   SpO2 100%   BMI 26.73 kg/m   Physical Exam  General appearance - alert, well appearing, and in no distress Mental status - normal mood, behavior, speech, dress, motor activity, and thought processes Eyes - pupils equal and reactive, extraocular eye movements intact Ears - bilateral TM's and external ear canals normal Nose - normal and patent, no erythema, discharge or polyps Mouth - mucous membranes moist, pharynx normal without lesions Neck - supple, no significant adenopathy Lymphatics - no palpable lymphadenopathy, no hepatosplenomegaly Chest - clear to auscultation, no wheezes, rales or rhonchi, symmetric air entry Heart - normal rate, regular rhythm, normal S1, S2, no murmurs, rubs, clicks or gallops Abdomen - soft, nontender, nondistended, no masses or organomegaly Breasts -my CMA Julius Bowels was present for breast and pelvic exam.  Breasts appear normal, no suspicious masses, no skin or nipple changes or axillary nodes Pelvic - normal external genitalia, vulva, vagina, cervix, uterus and adnexa Extremities - peripheral pulses normal, no pedal edema, no clubbing or cyanosis Skin - normal coloration and turgor, no rashes, no suspicious skin lesions noted  Depression screen [facility] 2/9 07/15/2020 03/20/2017 02/13/2017  Decreased Interest 0 0 1  Down, Depressed, Hopeless 0 0 1  PHQ - 2 Score 0 0 2  Altered sleeping - 0 3  Tired, decreased energy - 1 3  Change in appetite - 0 1  Feeling bad or failure about yourself  - 0 0  Trouble concentrating - 0 1  Moving slowly or fidgety/restless - 0 0  Suicidal thoughts - 0 0  PHQ-9 Score - 1 10    CMP Latest Ref Rng & Units 03/20/2017 10/24/2014  Glucose 65 - 99 mg/dL 72 94  BUN 6 - 20 mg/dL 12 8  Creatinine 2.03 - 1.00 mg/dL 5.59(R) 4.16  Sodium 384 - 144 mmol/L 140 136  Potassium 3.5 - 5.2 mmol/L 4.2 3.6    Chloride 96 - 106 mmol/L 103 100  CO2 20 - 29 mmol/L 24 25  Calcium 8.7 - 10.2 mg/dL 9.5 9.6  Total Protein 6.0 - 8.5 g/dL 7.7 8.1  Total Bilirubin 0.0 - 1.2 mg/dL 5.3(M) 4.6(O)  Alkaline Phos 39 - 117 IU/L 57 51  AST 0 - 40 IU/L 18 28  ALT 0 - 32 IU/L 18 23   Lipid Panel  No results found for: CHOL, TRIG, HDL, CHOLHDL, VLDL, LDLCALC, LDLDIRECT  CBC    Component Value Date/Time   WBC 8.0 03/20/2017 1121   WBC 12.4 (H) 12/26/2015 0110   RBC 4.70 03/20/2017 1121  RBC 4.69 12/26/2015 0110   HGB 12.8 03/20/2017 1121   HCT 39.9 03/20/2017 1121   PLT 316 03/20/2017 1121   MCV 85 03/20/2017 1121   MCH 27.2 03/20/2017 1121   MCH 27.7 12/26/2015 0110   MCHC 32.1 03/20/2017 1121   MCHC 34.1 12/26/2015 0110   RDW 13.7 03/20/2017 1121   LYMPHSABS 3.0 10/24/2014 1325   MONOABS 0.6 10/24/2014 1325   EOSABS 1.0 (H) 10/24/2014 1325   BASOSABS 0.1 10/24/2014 1325    ASSESSMENT AND PLAN: 1. Annual physical exam Commended her on regular exercise.  Encouraged her to keep up the good work. We discussed healthy eating habits.  Printed information given in Spanish  2. Pap smear for cervical cancer screening - Cervicovaginal ancillary only - Cytology - PAP  3. Over weight See #1 above - CBC - Comprehensive metabolic panel - Lipid panel - Hemoglobin A1c  4. Chronic idiopathic constipation Encourage patient to increase fiber in the diet.  Foods rich in fiber includes green leafy vegetables and fruits.  Encourage her to drink several glasses of water daily.  If constipation persists, she can try MiraLAX over-the-counter.  5. Need for hepatitis C screening test - Hepatitis C Antibody  6. Need for influenza vaccination Given today    Patient was given the opportunity to ask questions.  Patient verbalized understanding of the plan and was able to repeat key elements of the plan.  [employer] interpreter used during this encounter.  #[id number]  Orders Placed This Encounter  Procedures  .  CBC  . Comprehensive metabolic panel  . Lipid panel  . Hemoglobin A1c  . Hepatitis C Antibody     Requested Prescriptions    No prescriptions requested or ordered in this encounter    Return if symptoms worsen or fail to improve.  Jonah Blue, MD, FACP

## 2020-07-16 ENCOUNTER — Other Ambulatory Visit: Payer: Self-pay | Admitting: Internal Medicine

## 2020-07-16 LAB — CBC
Hematocrit: 40.6 % (ref 34.0–46.6)
Hemoglobin: 12.7 g/dL (ref 11.1–15.9)
MCH: 26.6 pg (ref 26.6–33.0)
MCHC: 31.3 g/dL — ABNORMAL LOW (ref 31.5–35.7)
MCV: 85 fL (ref 79–97)
Platelets: 295 10*3/uL (ref 150–450)
RBC: 4.78 x10E6/uL (ref 3.77–5.28)
RDW: 12.8 % (ref 11.7–15.4)
WBC: 9.8 10*3/uL (ref 3.4–10.8)

## 2020-07-16 LAB — CYTOLOGY - PAP
Comment: NEGATIVE
Diagnosis: NEGATIVE
High risk HPV: NEGATIVE

## 2020-07-16 LAB — HEMOGLOBIN A1C
Est. average glucose Bld gHb Est-mCnc: 105 mg/dL
Hgb A1c MFr Bld: 5.3 % (ref 4.8–5.6)

## 2020-07-16 LAB — CERVICOVAGINAL ANCILLARY ONLY
Bacterial Vaginitis (gardnerella): POSITIVE — AB
Candida Glabrata: NEGATIVE
Candida Vaginitis: NEGATIVE
Chlamydia: NEGATIVE
Comment: NEGATIVE
Comment: NEGATIVE
Comment: NEGATIVE
Comment: NEGATIVE
Comment: NEGATIVE
Comment: NORMAL
Neisseria Gonorrhea: NEGATIVE
Trichomonas: POSITIVE — AB

## 2020-07-16 LAB — LIPID PANEL
Chol/HDL Ratio: 3.7 ratio (ref 0.0–4.4)
Cholesterol, Total: 164 mg/dL (ref 100–199)
HDL: 44 mg/dL (ref >39)
LDL Chol Calc (NIH): 95 mg/dL (ref 0–99)
Triglycerides: 144 mg/dL (ref 0–149)
VLDL Cholesterol Cal: 25 mg/dL (ref 5–40)

## 2020-07-16 LAB — COMPREHENSIVE METABOLIC PANEL
ALT: 35 IU/L — ABNORMAL HIGH (ref 0–32)
AST: 19 IU/L (ref 0–40)
Albumin/Globulin Ratio: 1.6 (ref 1.2–2.2)
Albumin: 4.7 g/dL (ref 3.8–4.8)
Alkaline Phosphatase: 59 IU/L (ref 44–121)
BUN/Creatinine Ratio: 18 (ref 9–23)
BUN: 10 mg/dL (ref 6–20)
Bilirubin Total: 0.6 mg/dL (ref 0.0–1.2)
CO2: 23 mmol/L (ref 20–29)
Calcium: 9.4 mg/dL (ref 8.7–10.2)
Chloride: 105 mmol/L (ref 96–106)
Creatinine, Ser: 0.57 mg/dL (ref 0.57–1.00)
GFR calc Af Amer: 137 mL/min/{1.73_m2} (ref >59)
GFR calc non Af Amer: 119 mL/min/{1.73_m2} (ref >59)
Globulin, Total: 3 g/dL (ref 1.5–4.5)
Glucose: 74 mg/dL (ref 65–99)
Potassium: 4.5 mmol/L (ref 3.5–5.2)
Sodium: 142 mmol/L (ref 134–144)
Total Protein: 7.7 g/dL (ref 6.0–8.5)

## 2020-07-16 LAB — HEPATITIS C ANTIBODY: Hep C Virus Ab: <0.1 s/co ratio (ref 0.0–0.9)

## 2020-07-16 MED ORDER — METRONIDAZOLE 500 MG PO TABS: 500.0000 mg | ORAL_TABLET | Freq: Two times a day (BID) | ORAL | 0 refills | Status: DC

## 2020-07-16 NOTE — Progress Notes (Signed)
Let patient know that her Pap smear was normal meaning no cancerous cells seen.  She will need a repeat Pap smear in 5 years.  She did test positive for trichomonas which is a sexually transmitted infection.  Both she and her partner will need to be treated with antibiotics before they have unprotected sex again meaning sex without use of a condom.  I have sent a prescription to our pharmacy for the antibiotic called Flagyl for her to take twice a day for 7 days.

## 2020-07-16 NOTE — Progress Notes (Signed)
Normal result letter generated and mailed to address on file. 

## 2020-07-19 MED FILL — metroNIDAZOLE 500 MG TABS: 500 | 7 days supply | Qty: 14 | Fill #0

## 2021-01-27 ENCOUNTER — Ambulatory Visit: Payer: Self-pay | Admitting: Physician Assistant

## 2021-06-17 ENCOUNTER — Ambulatory Visit: Payer: Self-pay

## 2021-06-17 NOTE — Telephone Encounter (Signed)
Using Spanish interpreter [place], # [phone], pt. Reports 3 weeks of dizziness that comes and goes. Happened when she is up moving around. Will have a headache with this as well. Requests to be seen today if possible. Please advise pt.   Answer Assessment - Initial Assessment Questions 1. DESCRIPTION: "Describe your dizziness."     Dizzy 2. LIGHTHEADED: "Do you feel lightheaded?" (e.g., somewhat faint, woozy, weak upon standing)     Yes 3. VERTIGO: "Do you feel like either you or the room is spinning or tilting?" (i.e. vertigo)     No 4. SEVERITY: "How bad is it?"  "Do you feel like you are going to faint?" "Can you stand and walk?"   - MILD: Feels slightly dizzy, but walking normally.   - MODERATE: Feels unsteady when walking, but not falling; interferes with normal activities (e.g., school, work).   - SEVERE: Unable to walk without falling, or requires assistance to walk without falling; feels like passing out now.      Moderate 5. ONSET:  "When did the dizziness begin?"     3 weeks ago 6. AGGRAVATING FACTORS: "Does anything make it worse?" (e.g., standing, change in head position)     Moving 7. HEART RATE: "Can you tell me your heart rate?" "How many beats in 15 seconds?"  (Note: not all patients can do this)       No 8. CAUSE: "What do you think is causing the dizziness?"     Unsure 9. RECURRENT SYMPTOM: "Have you had dizziness before?" If Yes, ask: "When was the last time?" "What happened that time?"     No 10. OTHER SYMPTOMS: "Do you have any other symptoms?" (e.g., fever, chest pain, vomiting, diarrhea, bleeding)       Headache, nausea 11. PREGNANCY: "Is there any chance you are pregnant?" "When was your last menstrual period?"       No  Protocols used: Dizziness - Lightheadedness-A-AH

## 2021-06-17 NOTE — Telephone Encounter (Signed)
Spoke to patient using Spanish interpreter,  [place], [phone] with [employer].  Patient declines virtual appt. At this time with PCP.  Advised patient to hydrate and of sx's that would warrant ED visit. Patient verbalized understanding: weakness, blurred vision, headache, unbalance, slurred speech or worsening dizziness.   Patient verbalized understanding.

## 2021-07-18 ENCOUNTER — Ambulatory Visit: Payer: Self-pay | Admitting: *Deleted

## 2021-07-18 NOTE — Telephone Encounter (Signed)
Calls with numbness and tingling in both arms only at night while trying to sleep. After the tingling leaves arms she has "bone pain". Occurring for over one month now. She could do stretching and it would improve but not helping lately. No weakness and no other areas of numbness/tingling. Scheduled for 07/25/21 with Maurene Capes, PA. Patient will need Spanish interpreter.    Reason for Disposition  [1] Numbness or tingling in one or both feet AND [2] is a chronic symptom (recurrent or ongoing AND present > 4 weeks)  Answer Assessment - Initial Assessment Questions 1. SYMPTOM: "What is the main symptom you are concerned about?" (e.g., weakness, numbness)     Numbness and tingling and pain in both arms happens at night. 2. ONSET: "When did this start?" (minutes, hours, days; while sleeping)     About 4 weeks 3. LAST NORMAL: "When was the last time you (the patient) were normal (no symptoms)?"     Just before then 4. PATTERN "Does this come and go, or has it been constant since it started?"  "Is it present now?"     Happens at night only 5. CARDIAC SYMPTOMS: "Have you had any of the following symptoms: chest pain, difficulty breathing, palpitations?"     no 6. NEUROLOGIC SYMPTOMS: "Have you had any of the following symptoms: headache, dizziness, vision loss, double vision, changes in speech, unsteady on your feet?"     At times I have HA 7. OTHER SYMPTOMS: "Do you have any other symptoms?"     no 8. PREGNANCY: "Is there any chance you are pregnant?" "When was your last menstrual period?"     no  Protocols used: Neurologic Deficit-A-AH

## 2021-07-25 ENCOUNTER — Encounter: Payer: Self-pay | Admitting: Physician Assistant

## 2021-07-25 ENCOUNTER — Other Ambulatory Visit: Payer: Self-pay

## 2021-07-25 ENCOUNTER — Ambulatory Visit: Payer: Self-pay | Attending: Physician Assistant | Admitting: Physician Assistant

## 2021-07-25 VITALS — BP 119/77 | HR 73 | Temp 98.9°F | Resp 18 | Ht 62.0 in | Wt 136.0 lb

## 2021-07-25 DIAGNOSIS — M778 Other enthesopathies, not elsewhere classified: Secondary | ICD-10-CM

## 2021-07-25 MED ORDER — IBUPROFEN 600 MG PO TABS
600.0000 mg | ORAL_TABLET | Freq: Three times a day (TID) | ORAL | 0 refills | Status: AC | PRN
  Filled 2021-07-25: qty 30, 10d supply, fill #0

## 2021-07-25 MED ORDER — PREDNISONE 20 MG PO TABS
ORAL_TABLET | ORAL | 0 refills | Status: AC
  Filled 2021-07-25: qty 14, 12d supply, fill #0

## 2021-07-25 NOTE — Progress Notes (Signed)
Established Patient Office Visit  Subjective:  Patient ID: Brenda Dudley, female    DOB: June 20, 1981  Age: 40 y.o. MRN: [id number]  CC:  Chief Complaint  Patient presents with   Arm Pain    right    HPI Carolynn Tuley reports that she started having pain in her right shoulder approximately 1 month ago, states that she will have numbness and tingling down to her right hand, states it is worse at night, states that when she is sleeping her arm goes numb.  States that she sleeps on both her right and left side.  Does endorse that she is right-handed.  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    Due to language barrier, an interpreter was present during the history-taking and subsequent discussion (and for part of the physical exam) with this patient.    History reviewed. No pertinent past medical history.  Past Surgical History:  Procedure Laterality Date   NO PAST SURGERIES      History reviewed. No pertinent family history.  Social History   Socioeconomic History   Marital status: Media planner    Spouse name: Not on file   Number of children: 3   Years of education: Not on file   Highest education level: 7th grade  Occupational History   Not on file  Tobacco Use   Smoking status: Never   Smokeless tobacco: Never  Vaping Use   Vaping Use: Never used  Substance and Sexual Activity   Alcohol use: No   Drug use: No   Sexual activity: Yes    Birth control/protection: I.U.D.  Other Topics Concern   Not on file  Social History Narrative   Not on file   Social Determinants of Health   Financial Resource Strain: Not on file  Food Insecurity: Not on file  Transportation Needs: Not on file  Physical Activity: Not on file  Stress: Not on file  Social Connections: Not on file  Intimate Partner Violence: Not on file    Outpatient Medications Prior to Visit  Medication Sig  Dispense Refill   acetaminophen (TYLENOL) 500 MG tablet Take 500 mg by mouth every 6 (six) hours as needed.     levonorgestrel (MIRENA) 20 MCG/24HR IUD 1 each by Intrauterine route once.     cyclobenzaprine (FLEXERIL) 10 MG tablet Take 1 tablet (10 mg total) by mouth 2 (two) times daily as needed for muscle spasms. (Patient not taking: Reported on 07/15/2020) 20 tablet 0   Prenatal Vit-Fe Fumarate-FA (PRENATAL MULTIVITAMIN) TABS tablet Take 1 tablet by mouth daily at 12 noon. (Patient not taking: Reported on 02/13/2017) 30 tablet 1   senna-docusate (SENOKOT-S) 8.6-50 MG tablet Take 2 tablets by mouth 2 (two) times daily as needed for mild constipation or moderate constipation. (Patient not taking: Reported on 02/13/2017) 30 tablet 0   No facility-administered medications prior to visit.    No Known Allergies  ROS Review of Systems  Constitutional: Negative.   HENT: Negative.    Eyes: Negative.   Respiratory:  Negative for shortness of breath.   Cardiovascular:  Negative for chest pain.  Gastrointestinal: Negative.   Endocrine: Negative.   Genitourinary: Negative.   Musculoskeletal:  Positive for arthralgias and myalgias.  Skin: Negative.   Allergic/Immunologic: Negative.   Neurological: Negative.   Hematological: Negative.   Psychiatric/Behavioral: Negative.       Objective:    Physical Exam  Vitals and nursing note reviewed.  Constitutional:      Appearance: Normal appearance.  HENT:     Head: Normocephalic and atraumatic.     Right Ear: External ear normal.     Left Ear: External ear normal.     Nose: Nose normal.     Mouth/Throat:     Mouth: Mucous membranes are moist.     Pharynx: Oropharynx is clear.  Eyes:     Extraocular Movements: Extraocular movements intact.     Conjunctiva/sclera: Conjunctivae normal.     Pupils: Pupils are equal, round, and reactive to light.  Cardiovascular:     Rate and Rhythm: Normal rate and regular rhythm.     Pulses: Normal pulses.      Heart sounds: Normal heart sounds.  Pulmonary:     Effort: Pulmonary effort is normal.     Breath sounds: Normal breath sounds.  Musculoskeletal:     Right shoulder: Tenderness present. No swelling. Decreased range of motion. Decreased strength.     Left shoulder: Normal.     Right hand: No swelling. Decreased strength.     Left hand: Normal.     Cervical back: Normal range of motion and neck supple.  Skin:    General: Skin is warm and dry.  Neurological:     General: No focal deficit present.     Mental Status: She is alert and oriented to person, place, and time.  Psychiatric:        Mood and Affect: Mood normal.        Behavior: Behavior normal.        Thought Content: Thought content normal.        Judgment: Judgment normal.    BP 119/77 (BP Location: Left Arm, Patient Position: Sitting, Cuff Size: Normal)   Pulse 73   Temp 98.9 F (37.2 C) (Oral)   Resp 18   Ht 5\' 2"  (1.575 m)   Wt 136 lb (61.7 kg)   SpO2 99%   BMI 24.87 kg/m  Wt Readings from Last 3 Encounters:  07/25/21 136 lb (61.7 kg)  07/15/20 134 lb 9.6 oz (61.1 kg)  03/20/17 134 lb (60.8 kg)     Health Maintenance Due  Topic Date Due   INFLUENZA VACCINE  05/02/2021    There are no preventive care reminders to display for this patient.  No results found for: TSH Lab Results  Component Value Date   WBC 9.8 07/15/2020   HGB 12.7 07/15/2020   HCT 40.6 07/15/2020   MCV 85 07/15/2020   PLT 295 07/15/2020   Lab Results  Component Value Date   NA 142 07/15/2020   K 4.5 07/15/2020   CO2 23 07/15/2020   GLUCOSE 74 07/15/2020   BUN 10 07/15/2020   CREATININE 0.57 07/15/2020   BILITOT 0.6 07/15/2020   ALKPHOS 59 07/15/2020   AST 19 07/15/2020   ALT 35 (H) 07/15/2020   PROT 7.7 07/15/2020   ALBUMIN 4.7 07/15/2020   CALCIUM 9.4 07/15/2020   ANIONGAP 11 10/24/2014   Lab Results  Component Value Date   CHOL 164 07/15/2020   Lab Results  Component Value Date   HDL 44 07/15/2020   Lab Results   Component Value Date   LDLCALC 95 07/15/2020   Lab Results  Component Value Date   TRIG 144 07/15/2020   Lab Results  Component Value Date   CHOLHDL 3.7 07/15/2020   Lab Results  Component Value Date   HGBA1C 5.3 07/15/2020  Assessment & Plan:   Problem List Items Addressed This Visit   None Visit Diagnoses     Tendinitis of right shoulder    -  Primary   Relevant Medications   predniSONE (DELTASONE) 20 MG tablet   ibuprofen (ADVIL) 600 MG tablet       Meds ordered this encounter  Medications   predniSONE (DELTASONE) 20 MG tablet    Sig: Take 2 tablets (40 mg total) by mouth daily with breakfast for 4 days, THEN 1 tablet (20 mg total) daily with breakfast for 4 days, THEN 0.5 tablets (10 mg total) daily with breakfast for 4 days.    Dispense:  14 tablet    Refill:  0    Order Specific Question:   Supervising Provider    Answer:   Shan Levans E [[id number]]   ibuprofen (ADVIL) 600 MG tablet    Sig: Take 1 tablet (600 mg total) by mouth every 8 (eight) hours as needed.    Dispense:  30 tablet    Refill:  0    Order Specific Question:   Supervising Provider    Answer:   Delford Field, PATRICK E [[id number]]  1. Tendinitis of right shoulder Trial prednisone, ibuprofen.  Patient education given on supportive care.  Red flags given for prompt reevaluation. - predniSONE (DELTASONE) 20 MG tablet; Take 2 tablets (40 mg total) by mouth daily with breakfast for 4 days, THEN 1 tablet (20 mg total) daily with breakfast for 4 days, THEN 0.5 tablets (10 mg total) daily with breakfast for 4 days.  Dispense: 14 tablet; Refill: 0 - ibuprofen (ADVIL) 600 MG tablet; Take 1 tablet (600 mg total) by mouth every 8 (eight) hours as needed.  Dispense: 30 tablet; Refill: 0   I have reviewed the patient's medical history (PMH, PSH, Social History, Family History, Medications, and allergies) , and have been updated if relevant. I spent 30 minutes reviewing chart and  face to face time with  patient.     Follow-up: Return if symptoms worsen or fail to improve.    Kasandra Knudsen Mayers, PA-C

## 2021-07-25 NOTE — Progress Notes (Signed)
Patient has eaten today and has not taken medication. Patient reports pain and numbing in the right arm for the past month with an increase at night. Patient use tylenol with minimal relief.

## 2021-07-25 NOTE — Patient Instructions (Signed)
To help with your pain, you are going to use prednisone as directed and you can use ibuprofen 600 mg every 8 hours as needed.  Make sure you are doing gentle stretching and getting some rest.  Roney Jaffe, PA-C Physician Assistant [facility] [email or web address]   Tendinitis Tendinitis La tendinitis es la inflamacin de un tendn. Un tendn es un cordn fuerte de tejido que conecta el msculo con el hueso. La tendinitis puede afectar cualquier tendn, pero afecta con ms frecuencia los siguientes: Tendn del hombro (manguito rotador). Tendn del tobillo (tendn de [place]). Tendn del codo (tendn del trceps). Los tendones de [employer] Cules son las causas? Esta afeccin puede ser causada por lo siguiente: Uso excesivo de un tendn o msculo. Esto es frecuente. Desgaste relacionado con la edad. Lesiones. Afecciones inflamatorias, como la artritis. Ciertos medicamentos. Qu incrementa el riesgo? Tiene ms probabilidades de tener esta afeccin si realiza actividades que [employer] mismos movimientos una y Laverda Page (repetitivos). Cules son los signos o los sntomas? Los sntomas de esta afeccin pueden incluir los siguientes: Engineer, mining. Sensibilidad. Inflamacin leve. Disminucin de la amplitud de movimientos. Cmo se diagnostica? Esta afeccin se diagnostica mediante un examen fsico. Tambin pueden hacerle estudios, por ejemplo: Una ecografa. Esta utiliza ondas de sonido para tomar una imagen del interior del cuerpo en la zona afectada. Una resonancia magntica (RM). Cmo se trata? Esta afeccin puede tratarse con reposo, hielo, presin (compresin) y levantamiento (elevacin) de la zona afectada por encima del nivel del corazn. Esto se conoce como terapia RHCE. El tratamiento tambin puede incluir lo siguiente: Medicamentos para ayudar a Ambulance person. Ejercicios o terapia fsica para  fortalecer y Metallurgist tendn. Un dispositivo ortopdico o una frula. [place]. Esto debe realizarse en contadas ocasiones. Siga estas indicaciones en su casa: Si tiene una frula o un dispositivo ortopdico: Use la frula o el dispositivo ortopdico como se lo haya indicado el mdico. Quteselo solamente como se lo haya indicado el mdico. Afloje la frula o el dispositivo ortopdico si los dedos de las manos o de los pies se le entumecen, siente hormigueos o se le enfran y se tornan de Research officer, trade union. Mantenga la frula o el dispositivo ortopdico limpios. Si la frula o el dispositivo ortopdico no son impermeables: No deje que se mojen. Cbralo con un envoltorio hermtico cuando tome un bao de inmersin o una ducha. Control del dolor, la rigidez y la hinchazn Si se lo indican, aplique hielo sobre la zona afectada. Si tiene un dispositivo ortopdico o una frula desmontable, quteselos solo como se lo haya indicado el mdico. Ponga el hielo en una bolsa plstica. Coloque una toalla entre la piel y Copy. Coloque el hielo durante 20 minutos, 2 a 3 veces por da. Mueva los dedos de las manos o de los pies (de los miembros afectados) con frecuencia, si esto corresponde. Esto puede ayudar a Transport planner rigidez y Personnel officer. Si se lo indican, levante (eleve) la zona afectada por encima del nivel del corazn cuando est sentado o acostado. Si se lo indican, aplique calor en la zona afectada antes de realizar ejercicios. Use la fuente de calor que el mdico le recomiende, como una compresa de calor hmedo o una almohadilla trmica.   Coloque una toalla entre la piel y la fuente de Airline pilot. Aplique calor durante 20 a 30 minutos. Retire la fuente de calor si la piel se pone de color rojo brillante. Esto es especialmente  importante si no puede sentir dolor, calor o fro. Puede correr un riesgo mayor de sufrir quemaduras. Conducir No conduzca ni use maquinaria pesada mientras toma  analgsicos recetados. Pregntele al mdico cundo puede volver a conducir vehculos, si tiene una frula o un dispositivo ortopdico en el brazo o la pierna. Actividad Haga reposo para descansar la zona afectada, como se lo haya indicado el mdico. Retome sus actividades normales como se lo haya indicado el mdico. Pregntele al mdico qu actividades son seguras para usted. Evite usar la zona afectada mientras experimenta sntomas de tendinitis. Haga ejercicio como se lo haya indicado el mdico. Indicaciones generales No ejerza presin en ninguna parte de la frula hasta que se haya endurecido por completo. Esto puede tardar varias horas. Use una venda elstica o de compresin nicamente como se lo haya indicado el mdico. Tome los medicamentos de venta libre y los recetados solamente como se lo haya indicado el mdico. Oceanographer a todas las visitas de seguimiento como se lo haya indicado el mdico. Esto es importante. Comunquese con un mdico si: Sus sntomas no mejoran. Surgen problemas nuevos que son inexplicables, como adormecimiento en las manos. Resumen La tendinitis es la inflamacin de un tendn. Tiene ms probabilidades de tenga esta afeccin si realiza actividades que [employer] mismos movimientos una y [place]. Esta afeccin puede tratarse con reposo, hielo, presin (compresin) y elevacin de la zona afectada por encima del nivel del corazn. Esto se conoce como terapia RHCE. Evite usar la zona afectada mientras experimenta sntomas de tendinitis. Esta informacin no tiene Theme park manager el consejo del mdico. Asegrese de hacerle al mdico cualquier pregunta que tenga. Document Revised: 03/28/2018 Document Reviewed: 03/28/2018 Elsevier Patient Education  2022 [employer].

## 2021-08-29 ENCOUNTER — Telehealth: Payer: Self-pay | Admitting: Internal Medicine

## 2021-08-29 NOTE — Telephone Encounter (Signed)
Patient would like to apply for an [employer]. Would like a call back please

## 2021-09-13 NOTE — Telephone Encounter (Signed)
Can you assist?

## 2021-09-13 NOTE — Telephone Encounter (Signed)
I return Pt call, schedule a financial appt for 09/20/21

## 2021-09-20 ENCOUNTER — Ambulatory Visit: Payer: Self-pay | Attending: Family Medicine

## 2022-07-17 ENCOUNTER — Ambulatory Visit: Payer: Self-pay | Admitting: *Deleted

## 2022-07-17 NOTE — Telephone Encounter (Signed)
  Chief Complaint: Abdominal pain Symptoms: Lower abdominal pain x 3-4 weeks, since yesterday, constant sharp left sided pain 6/10 Frequency: Yesterday Pertinent Negatives: Patient denies fever, dysuria, N/V/D Disposition: [x] ED /[] Urgent Care (no appt availability in office) / [] Appointment(In office/virtual)/ []  [facility]/ [] Home Care/ [] Refused Recommended Disposition /[] [facility]/ []  Follow-up with PCP Additional Notes: Advised ED. Pt states will follow disposition. Assisted by Interpreter Johney Maine  # [phone]  Reason for Disposition  [1] SEVERE pain (e.g., excruciating) AND [2] present > 1 hour  Answer Assessment - Initial Assessment Questions 1. LOCATION: "Where does it hurt?"      Lower stomach 2. RADIATION: "Does the pain shoot anywhere else?" (e.g., chest, back)     Left side, sharp. Onset yesterday 3. ONSET: "When did the pain begin?" (e.g., minutes, hours or days ago)      3-4 weeks 4. SUDDEN: "Gradual or sudden onset?"     Left sided sudden 5. PATTERN "Does the pain come and go, or is it constant?"    - If it comes and goes: "How long does it last?" "Do you have pain now?"     (Note: Comes and goes means the pain is intermittent. It goes away completely between bouts.)    - If constant: "Is it getting better, staying the same, or getting worse?"      (Note: Constant means the pain never goes away completely; most serious pain is constant and gets worse.)      Lower abdomen, not daily. Lower back sharp constan 6. SEVERITY: "How bad is the pain?"  (e.g., Scale 1-10; mild, moderate, or severe)    - MILD (1-3): Doesn't interfere with normal activities, abdomen soft and not tender to touch.     - MODERATE (4-7): Interferes with normal activities or awakens from sleep, abdomen tender to touch.     - SEVERE (8-10): Excruciating pain, doubled over, unable to do any normal activities.       6/10 7. RECURRENT SYMPTOM: "Have you ever had this type of stomach  pain before?" If Yes, ask: "When was the last time?" and "What happened that time?"      No 8. CAUSE: "What do you think is causing the stomach pain?"     unsure 9. RELIEVING/AGGRAVATING FACTORS: "What makes it better or worse?" (e.g., antacids, bending or twisting motion, bowel movement)      10. OTHER SYMPTOMS: "Do you have any other symptoms?" (e.g., back pain, diarrhea, fever, urination pain, vomiting)       No  Protocols used: Abdominal Pain - Female-A-AH

## 2022-11-19 ENCOUNTER — Emergency Department (HOSPITAL_COMMUNITY): Payer: No Typology Code available for payment source

## 2022-11-19 ENCOUNTER — Encounter (HOSPITAL_COMMUNITY): Payer: Self-pay

## 2022-11-19 ENCOUNTER — Emergency Department (HOSPITAL_COMMUNITY)
Admission: EM | Admit: 2022-11-19 | Discharge: 2022-11-19 | Disposition: A | Discharge status: Home / Self Care | Payer: No Typology Code available for payment source | Attending: Emergency Medicine | Admitting: Emergency Medicine

## 2022-11-19 ENCOUNTER — Other Ambulatory Visit: Payer: Self-pay

## 2022-11-19 DIAGNOSIS — S20219A Contusion of unspecified front wall of thorax, initial encounter: Secondary | ICD-10-CM | POA: Diagnosis not present

## 2022-11-19 DIAGNOSIS — S299XXA Unspecified injury of thorax, initial encounter: Secondary | ICD-10-CM | POA: Diagnosis present

## 2022-11-19 DIAGNOSIS — Y92410 Unspecified street and highway as the place of occurrence of the external cause: Secondary | ICD-10-CM | POA: Diagnosis not present

## 2022-11-19 MED ORDER — IBUPROFEN 800 MG PO TABS
800.0000 mg | ORAL_TABLET | Freq: Once | ORAL | Status: AC
  Administered 2022-11-19: 800 mg via ORAL
  Filled 2022-11-19: qty 1

## 2022-11-19 NOTE — ED Notes (Signed)
Pt was given a cup of apple juice

## 2022-11-19 NOTE — Discharge Instructions (Addendum)
You appear to have a chest bruise. You can apply ice and take Ibuprofen and Tylenol for pain. If you develop new or worsening chest pain, trouble breathing, or any other new/concerning symptoms then return to the ER or call 911.  Parece tener un hematoma en el pecho. Puedes aplicar hielo y tomar ibuprofeno y Tylenol para Conservation officer, historic buildings. Si presenta dolor de pecho nuevo o que empeora, dificultad para respirar o cualquier otro sntoma nuevo o preocupante, regrese a la sala de emergencias o llame al 911.
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
